# Patient Record
Sex: Female | Born: 2000 | Race: Black or African American | Hispanic: No | Smoking: Never smoker
Health system: Southern US, Community
[De-identification: ages and names within clinical notes are randomized; demographics above are authoritative.]

## PROBLEM LIST (undated history)

## (undated) ENCOUNTER — Inpatient Hospital Stay (HOSPITAL_COMMUNITY): Payer: Self-pay

## (undated) ENCOUNTER — Inpatient Hospital Stay (HOSPITAL_COMMUNITY): Payer: Medicaid Other | Admitting: Obstetrics and Gynecology

## (undated) DIAGNOSIS — B9689 Other specified bacterial agents as the cause of diseases classified elsewhere: Secondary | ICD-10-CM

## (undated) DIAGNOSIS — J45909 Unspecified asthma, uncomplicated: Secondary | ICD-10-CM

## (undated) DIAGNOSIS — R109 Unspecified abdominal pain: Secondary | ICD-10-CM

## (undated) DIAGNOSIS — R51 Headache: Secondary | ICD-10-CM

## (undated) DIAGNOSIS — N912 Amenorrhea, unspecified: Secondary | ICD-10-CM

## (undated) DIAGNOSIS — E669 Obesity, unspecified: Secondary | ICD-10-CM

## (undated) DIAGNOSIS — O26899 Other specified pregnancy related conditions, unspecified trimester: Secondary | ICD-10-CM

## (undated) DIAGNOSIS — N760 Acute vaginitis: Secondary | ICD-10-CM

## (undated) HISTORY — DX: Headache: R51

## (undated) HISTORY — DX: Obesity, unspecified: E66.9

## (undated) HISTORY — PX: WISDOM TOOTH EXTRACTION: SHX21

---
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
## 2018-01-07 ENCOUNTER — Inpatient Hospital Stay (HOSPITAL_COMMUNITY)
Admission: AD | Admit: 2018-01-07 | Discharge: 2018-01-09 | DRG: 807 | Disposition: A | Discharge status: Home / Self Care | Payer: Medicaid Other | Source: Ambulatory Visit | Attending: Obstetrics and Gynecology | Admitting: Obstetrics and Gynecology

## 2018-01-07 ENCOUNTER — Encounter (HOSPITAL_COMMUNITY): Payer: Self-pay | Admitting: *Deleted

## 2018-01-07 ENCOUNTER — Inpatient Hospital Stay (HOSPITAL_COMMUNITY): Payer: Medicaid Other | Admitting: Anesthesiology

## 2018-01-07 ENCOUNTER — Ambulatory Visit (INDEPENDENT_AMBULATORY_CARE_PROVIDER_SITE_OTHER): Payer: Medicaid Other | Admitting: Certified Nurse Midwife

## 2018-01-07 VITALS — BP 125/90 | HR 68 | Wt 183.9 lb

## 2018-01-07 DIAGNOSIS — O4292 Full-term premature rupture of membranes, unspecified as to length of time between rupture and onset of labor: Principal | ICD-10-CM | POA: Diagnosis present

## 2018-01-07 DIAGNOSIS — R109 Unspecified abdominal pain: Secondary | ICD-10-CM

## 2018-01-07 DIAGNOSIS — Z880 Allergy status to penicillin: Secondary | ICD-10-CM

## 2018-01-07 DIAGNOSIS — O26899 Other specified pregnancy related conditions, unspecified trimester: Secondary | ICD-10-CM | POA: Diagnosis present

## 2018-01-07 DIAGNOSIS — Z3A38 38 weeks gestation of pregnancy: Secondary | ICD-10-CM

## 2018-01-07 DIAGNOSIS — O99214 Obesity complicating childbirth: Secondary | ICD-10-CM | POA: Diagnosis present

## 2018-01-07 DIAGNOSIS — Z87891 Personal history of nicotine dependence: Secondary | ICD-10-CM

## 2018-01-07 DIAGNOSIS — E669 Obesity, unspecified: Secondary | ICD-10-CM | POA: Diagnosis present

## 2018-01-07 DIAGNOSIS — Z3402 Encounter for supervision of normal first pregnancy, second trimester: Secondary | ICD-10-CM

## 2018-01-07 DIAGNOSIS — Z3403 Encounter for supervision of normal first pregnancy, third trimester: Secondary | ICD-10-CM

## 2018-01-07 LAB — CBC
HEMATOCRIT: 36.5 % (ref 36.0–46.0)
HEMOGLOBIN: 12.4 g/dL (ref 12.0–15.0)
MCH: 29.5 pg (ref 26.0–34.0)
MCHC: 34 g/dL (ref 30.0–36.0)
MCV: 86.7 fL (ref 78.0–100.0)
Platelets: 187 10*3/uL (ref 150–400)
RBC: 4.21 MIL/uL (ref 3.87–5.11)
RDW: 13 % (ref 11.5–15.5)
WBC: 11.4 10*3/uL — ABNORMAL HIGH (ref 4.0–10.5)

## 2018-01-07 LAB — POCT FERN TEST: POCT FERN TEST: POSITIVE

## 2018-01-07 LAB — TYPE AND SCREEN
ABO/RH(D): O POS
ANTIBODY SCREEN: NEGATIVE

## 2018-01-07 MED ORDER — FLEET ENEMA 7-19 GM/118ML RE ENEM: 1.0000 | ENEMA | RECTAL | Status: DC | PRN

## 2018-01-07 MED ORDER — FENTANYL CITRATE (PF) 100 MCG/2ML IJ SOLN
100.0000 ug | INTRAMUSCULAR | Status: DC | PRN
  Administered 2018-01-07 (×2): 100 ug via INTRAVENOUS
  Filled 2018-01-07 (×2): qty 2

## 2018-01-07 MED ORDER — TETANUS-DIPHTH-ACELL PERTUSSIS 5-2.5-18.5 LF-MCG/0.5 IM SUSP
0.5000 mL | Freq: Once | INTRAMUSCULAR | Status: AC
  Administered 2018-01-08: 0.5 mL via INTRAMUSCULAR

## 2018-01-07 MED ORDER — DIPHENHYDRAMINE HCL 25 MG PO CAPS
25.0000 mg | ORAL_CAPSULE | Freq: Four times a day (QID) | ORAL | Status: DC | PRN
  Administered 2018-01-08: 25 mg via ORAL
  Filled 2018-01-07: qty 1

## 2018-01-07 MED ORDER — SOD CITRATE-CITRIC ACID 500-334 MG/5ML PO SOLN: 30.0000 mL | ORAL | Status: DC | PRN

## 2018-01-07 MED ORDER — LACTATED RINGERS IV SOLN: 500.0000 mL | INTRAVENOUS | Status: DC | PRN

## 2018-01-07 MED ORDER — SENNOSIDES-DOCUSATE SODIUM 8.6-50 MG PO TABS
2.0000 | ORAL_TABLET | ORAL | Status: DC
  Administered 2018-01-08 (×2): 2 via ORAL
  Filled 2018-01-07 (×2): qty 2

## 2018-01-07 MED ORDER — ONDANSETRON HCL 4 MG PO TABS: 4.0000 mg | ORAL_TABLET | ORAL | Status: DC | PRN

## 2018-01-07 MED ORDER — EPHEDRINE 5 MG/ML INJ
10.0000 mg | INTRAVENOUS | Status: DC | PRN
  Filled 2018-01-07: qty 2

## 2018-01-07 MED ORDER — OXYTOCIN 40 UNITS IN LACTATED RINGERS INFUSION - SIMPLE MED
2.5000 [IU]/h | INTRAVENOUS | Status: DC
  Administered 2018-01-07: 2.5 [IU]/h via INTRAVENOUS

## 2018-01-07 MED ORDER — FENTANYL 2.5 MCG/ML BUPIVACAINE 1/10 % EPIDURAL INFUSION (WH - ANES)
14.0000 mL/h | INTRAMUSCULAR | Status: DC | PRN
  Administered 2018-01-07: 14 mL/h via EPIDURAL
  Filled 2018-01-07: qty 100

## 2018-01-07 MED ORDER — DIBUCAINE 1 % RE OINT: 1.0000 "application " | TOPICAL_OINTMENT | RECTAL | Status: DC | PRN

## 2018-01-07 MED ORDER — OXYTOCIN 40 UNITS IN LACTATED RINGERS INFUSION - SIMPLE MED
1.0000 m[IU]/min | INTRAVENOUS | Status: DC
  Administered 2018-01-07: 4 m[IU]/min via INTRAVENOUS
  Administered 2018-01-07: 2 m[IU]/min via INTRAVENOUS
  Filled 2018-01-07: qty 1000

## 2018-01-07 MED ORDER — ONDANSETRON HCL 4 MG/2ML IJ SOLN
4.0000 mg | Freq: Four times a day (QID) | INTRAMUSCULAR | Status: DC | PRN
  Administered 2018-01-07: 4 mg via INTRAVENOUS
  Filled 2018-01-07: qty 2

## 2018-01-07 MED ORDER — ACETAMINOPHEN 325 MG PO TABS: 650.0000 mg | ORAL_TABLET | ORAL | Status: DC | PRN

## 2018-01-07 MED ORDER — OXYCODONE-ACETAMINOPHEN 5-325 MG PO TABS: 1.0000 | ORAL_TABLET | ORAL | Status: DC | PRN

## 2018-01-07 MED ORDER — MISOPROSTOL 200 MCG PO TABS
600.0000 ug | ORAL_TABLET | Freq: Once | ORAL | Status: AC
  Administered 2018-01-07: 600 ug via ORAL

## 2018-01-07 MED ORDER — BENZOCAINE-MENTHOL 20-0.5 % EX AERO
1.0000 "application " | INHALATION_SPRAY | CUTANEOUS | Status: DC | PRN
  Administered 2018-01-08: 1 via TOPICAL
  Filled 2018-01-07: qty 56

## 2018-01-07 MED ORDER — OXYCODONE-ACETAMINOPHEN 5-325 MG PO TABS: 2.0000 | ORAL_TABLET | ORAL | Status: DC | PRN

## 2018-01-07 MED ORDER — LACTATED RINGERS IV SOLN
INTRAVENOUS | Status: DC
  Administered 2018-01-07 (×2): via INTRAVENOUS

## 2018-01-07 MED ORDER — PRENATAL MULTIVITAMIN CH
1.0000 | ORAL_TABLET | Freq: Every day | ORAL | Status: DC
  Administered 2018-01-08: 1 via ORAL
  Filled 2018-01-07: qty 1

## 2018-01-07 MED ORDER — COCONUT OIL OIL
1.0000 "application " | TOPICAL_OIL | Status: DC | PRN
  Filled 2018-01-07: qty 120

## 2018-01-07 MED ORDER — PHENYLEPHRINE 40 MCG/ML (10ML) SYRINGE FOR IV PUSH (FOR BLOOD PRESSURE SUPPORT)
80.0000 ug | PREFILLED_SYRINGE | INTRAVENOUS | Status: DC | PRN
  Filled 2018-01-07: qty 5
  Filled 2018-01-07: qty 10

## 2018-01-07 MED ORDER — ONDANSETRON HCL 4 MG/2ML IJ SOLN: 4.0000 mg | INTRAMUSCULAR | Status: DC | PRN

## 2018-01-07 MED ORDER — LACTATED RINGERS IV SOLN: 500.0000 mL | Freq: Once | INTRAVENOUS | Status: DC

## 2018-01-07 MED ORDER — ACETAMINOPHEN 325 MG PO TABS
650.0000 mg | ORAL_TABLET | ORAL | Status: DC | PRN
  Administered 2018-01-08 – 2018-01-09 (×3): 650 mg via ORAL
  Filled 2018-01-07 (×3): qty 2

## 2018-01-07 MED ORDER — LIDOCAINE HCL (PF) 1 % IJ SOLN
INTRAMUSCULAR | Status: DC | PRN
  Administered 2018-01-07: 6 mL via EPIDURAL
  Administered 2018-01-07: 4 mL

## 2018-01-07 MED ORDER — PHENYLEPHRINE 40 MCG/ML (10ML) SYRINGE FOR IV PUSH (FOR BLOOD PRESSURE SUPPORT)
80.0000 ug | PREFILLED_SYRINGE | INTRAVENOUS | Status: DC | PRN
  Filled 2018-01-07: qty 5

## 2018-01-07 MED ORDER — ZOLPIDEM TARTRATE 5 MG PO TABS: 5.0000 mg | ORAL_TABLET | Freq: Every evening | ORAL | Status: DC | PRN

## 2018-01-07 MED ORDER — IBUPROFEN 600 MG PO TABS
600.0000 mg | ORAL_TABLET | Freq: Four times a day (QID) | ORAL | Status: DC
  Administered 2018-01-08 – 2018-01-09 (×6): 600 mg via ORAL
  Filled 2018-01-07 (×7): qty 1

## 2018-01-07 MED ORDER — LIDOCAINE HCL (PF) 1 % IJ SOLN
30.0000 mL | INTRAMUSCULAR | Status: DC | PRN
  Filled 2018-01-07: qty 30

## 2018-01-07 MED ORDER — WITCH HAZEL-GLYCERIN EX PADS: 1.0000 "application " | MEDICATED_PAD | CUTANEOUS | Status: DC | PRN

## 2018-01-07 MED ORDER — DIPHENHYDRAMINE HCL 50 MG/ML IJ SOLN: 12.5000 mg | INTRAMUSCULAR | Status: DC | PRN

## 2018-01-07 MED ORDER — BUDESONIDE 0.25 MG/2ML IN SUSP
0.2500 mg | Freq: Two times a day (BID) | RESPIRATORY_TRACT | Status: DC
  Filled 2018-01-07 (×4): qty 2

## 2018-01-07 MED ORDER — SIMETHICONE 80 MG PO CHEW: 80.0000 mg | CHEWABLE_TABLET | ORAL | Status: DC | PRN

## 2018-01-07 MED ORDER — TERBUTALINE SULFATE 1 MG/ML IJ SOLN
0.2500 mg | Freq: Once | INTRAMUSCULAR | Status: DC | PRN
  Filled 2018-01-07: qty 1

## 2018-01-07 MED ORDER — OXYTOCIN BOLUS FROM INFUSION
500.0000 mL | Freq: Once | INTRAVENOUS | Status: AC
  Administered 2018-01-07: 500 mL via INTRAVENOUS

## 2018-01-07 MED ORDER — MISOPROSTOL 200 MCG PO TABS
ORAL_TABLET | ORAL | Status: AC
  Filled 2018-01-07: qty 3

## 2018-01-07 NOTE — Anesthesia Procedure Notes (Signed)
Epidural Patient location during procedure: OB  Staffing Anesthesiologist: Cristela BlueJackson, Cylee Dattilo, MD  Preanesthetic Checklist Completed: patient identified, pre-op evaluation, timeout performed, IV checked, risks and benefits discussed and monitors and equipment checked  Epidural Patient position: sitting Prep: DuraPrep Patient monitoring: blood pressure and continuous pulse ox Approach: right paramedian Location: L3-L4 Injection technique: LOR air  Needle:  Needle type: Tuohy  Needle gauge: 17 G Needle insertion depth: 6 cm Catheter type: closed end flexible Catheter size: 19 Gauge Catheter at skin depth: 13 cm Test dose: negative  Assessment Sensory level: T8  Additional Notes   Dosing of Epidural:  1st dose, through catheter ............................................Marland Kitchen.  Xylocaine 40 mg  2nd dose, through catheter, after waiting 3 minutes........Marland Kitchen.Xylocaine 60 mg    As each dose occurred, patient was free of IV sx; and patient exhibited no evidence of SA injection.  Patient is more comfortable after epidural dosed. Please see RN's note for documentation of vital signs,and FHR which are stable.  Patient reminded not to try to ambulate with numb legs, and that an RN must be present when she attempts to get up.

## 2018-01-07 NOTE — Progress Notes (Signed)
Kristie Mcintosh is a 17 y.o. G1P0000 at 5835w4d  admitted for rupture of membranes early labor  Subjective: Pt having a Cat II strip with variable decels to 80's with contractions, pitocin has now been d/c'd and amnioinfusion initiated.  Bedside u/s shows a LNC x 1 at shoulder and under chin.   Objective: BP (!) 98/49   Pulse 64   Temp 98.3 F (36.8 C) (Oral)   Resp 18   Ht 4\' 11"  (1.499 m)   Wt 183 lb (83 kg)   LMP 04/12/2017   SpO2 100%   BMI 36.96 kg/m  No intake/output data recorded. Total I/O In: -  Out: 200 [Urine:200]  FHT:  FHR: 145 bpm, variability: moderate,  accelerations:  Present,  decelerations:  Present variables  UC:   regular, every 3 minutes SVE:   Dilation: 6 Effacement (%): 90 Station: -2 Exam by:: Dr. Emelda FearFerguson  Labs: Lab Results  Component Value Date   WBC 11.4 (H) 01/07/2018   HGB 12.4 01/07/2018   HCT 36.5 01/07/2018   MCV 86.7 01/07/2018   PLT 187 01/07/2018    Assessment / Plan: Spontaneous labor, progressing normally  Labor: watching cautously, will try terbutalene x1 if decels persist. Preeclampsia:   Fetal Wellbeing:  Category II Pain Control:  Epidural I/D:  n/a Anticipated MOD:  uncertain   PT informed of our concerns and the possibility that we will recommend cesarean if the decels progress or fail to improve.  Kristie Mcintosh 01/07/2018, 6:23 PM

## 2018-01-07 NOTE — MAU Note (Signed)
Leaking fluid, clothes wet, back to restroom to change

## 2018-01-07 NOTE — MAU Note (Signed)
Pt presents with c/o LOF @ 0800 this morning, fluid clear.  Denies onset of ctxs.  Denies VB.  Reports +FM.

## 2018-01-07 NOTE — Progress Notes (Signed)
Pt c/o leaking water and irregular contractions.  She states she is unsure if urine or if water has broken. Pt sent to MAU d/t water breakage.

## 2018-01-07 NOTE — Anesthesia Pain Management Evaluation Note (Signed)
  CRNA Pain Management Visit Note  Patient: Kristie Mcintosh, 17 y.o., female  "Hello I am a member of the anesthesia team at [facility]. We have an anesthesia team available at all times to provide care throughout the hospital, including epidural management and anesthesia for C-section. I don't know your plan for the delivery whether it a natural birth, water birth, IV sedation, nitrous supplementation, doula or epidural, but we want to meet your pain goals."   1.Was your pain managed to your expectations on prior hospitalizations?   No prior hospitalizations  2.What is your expectation for pain management during this hospitalization?     Epidural and IV pain meds  3.How can we help you reach that goal? IV pain meds, epidural when ready. Patient is aware of all options.  Record the patient's initial score and the patient's pain goal.   Pain: 6--RN Trula Orehristina made aware of patient's desire for pain meds  Pain Goal: 3 [facility] wants you to be able to say your pain was always managed very well.  Ronnisha Felber L 01/07/2018

## 2018-01-07 NOTE — Anesthesia Preprocedure Evaluation (Signed)
Anesthesia Evaluation  Patient identified by MRN, date of birth, ID band Patient awake    Reviewed: Allergy & Precautions, H&P , Patient's Chart, lab work & pertinent test results  Airway Mallampati: II  TM Distance: >3 FB Neck ROM: full    Dental no notable dental hx.    Pulmonary asthma , former smoker,    Pulmonary exam normal breath sounds clear to auscultation       Cardiovascular Exercise Tolerance: Good  Rhythm:regular Rate:Normal     Neuro/Psych    GI/Hepatic   Endo/Other  Morbid obesity  Renal/GU      Musculoskeletal   Abdominal   Peds  Hematology   Anesthesia Other Findings Rare inhaler use  Reproductive/Obstetrics                             Anesthesia Physical Anesthesia Plan  ASA: II  Anesthesia Plan: Epidural   Post-op Pain Management:    Induction:   PONV Risk Score and Plan:   Airway Management Planned:   Additional Equipment:   Intra-op Plan:   Post-operative Plan:   Informed Consent: I have reviewed the patients History and Physical, chart, labs and discussed the procedure including the risks, benefits and alternatives for the proposed anesthesia with the patient or authorized representative who has indicated his/her understanding and acceptance.   Dental Advisory Given  Plan Discussed with:   Anesthesia Plan Comments: (Labs checked- platelets confirmed with RN in room. Fetal heart tracing, per RN, reported to be stable enough for sitting procedure. Discussed epidural, and patient consents to the procedure:  included risk of possible headache,backache, failed block, allergic reaction, and nerve injury. This patient was asked if she had any questions or concerns before the procedure started.)        Anesthesia Quick Evaluation

## 2018-01-07 NOTE — H&P (Signed)
Kristie Mcintosh is a 17 y.o. female G1P0000 @[redacted]w[redacted]d  presenting for SROM 01/07/18 at 0800.  She reports good fetal movement, denies vaginal bleeding, vaginal itching/burning, urinary symptoms, h/a, dizziness, n/v, or fever/chills.     Clinic [facility]  Prenatal Labs  Dating lmp c/w 5.5w sono Blood type: O/Positive/-- (07/23 1543) o pos  Genetic Screen 1 Screen: nl   AFP:     neg Antibody:Negative (07/23 1543)Neg  Anatomic US  normal; female fetus Rubella: 1.64 (07/23 1543)Immune  GTT Third trimester: WNL RPR: Non Reactive (11/29 1030) NR  Flu vaccine  declined 09/25/17 HBsAg: Negative (07/23 1543) Neg  TDaP vaccine   Declined 11/06/17                                Rhogam:n/a O+ HIV: Non Reactive (11/29 1030) NR  Baby Food Breast                                       GBS: NEG  Contraception Nexplanon Pap: underage   Circumcision  yes   Pediatrician Dr. Deatra Cantereclair  CF: negative  Support Person Pt's father  SMA  Prenatal Classes No Hgb electrophoresis: nl   OB History    Gravida Para Term Preterm AB Living   1 0 0 0 0 0   SAB TAB Ectopic Multiple Live Births   0 0 0 0 0     Past Medical History:  Diagnosis Date  . Abdominal pain affecting pregnancy 05/16/2017  . Amenorrhea 05/16/2017  . Asthma   . Bacterial vaginitis 05/16/2017  . Headache(784.0)   . Obesity    Past Surgical History:  Procedure Laterality Date  . ADENOIDECTOMY AND MYRINGOTOMY WITH TUBE PLACEMENT Bilateral 2001  . WISDOM TOOTH EXTRACTION Bilateral    Family History: family history includes Autism in her cousin; Bipolar disorder in her paternal aunt; Depression in her maternal grandmother; Diabetes in her other; Headache in her mother; Heart Problems in her maternal grandfather; Heart disease in her mother and other; Hyperlipidemia in her other; Hypertension in her other; Migraines in her maternal grandmother. Social History:  reports that she quit smoking about 8 months ago. Her smoking use included cigarettes. She smoked 0.25  packs per day. she has never used smokeless tobacco. She reports that she does not drink alcohol or use drugs.     Maternal Diabetes: No Genetic Screening: Normal Maternal Ultrasounds/Referrals: Normal Fetal Ultrasounds or other Referrals:  None Maternal Substance Abuse:  No Significant Maternal Medications:  None Significant Maternal Lab Results:  Lab values include: Group B Strep negative Other Comments:  None  Review of Systems  Constitutional: Negative for chills, fever and malaise/fatigue.  Eyes: Negative for blurred vision.  Respiratory: Negative for cough and shortness of breath.   Cardiovascular: Negative for chest pain.  Gastrointestinal: Positive for abdominal pain. Negative for heartburn and vomiting.  Genitourinary: Negative for dysuria, frequency and urgency.  Musculoskeletal: Negative.   Neurological: Negative for dizziness and headaches.  Psychiatric/Behavioral: Negative for depression.   Maternal Medical History:  Reason for admission: Rupture of membranes.   Contractions: Onset was 6-12 hours ago.   Frequency: irregular.   Perceived severity is mild.    Fetal activity: Perceived fetal activity is normal.   Last perceived fetal movement was within the past hour.    Prenatal complications: no prenatal complications Prenatal  Complications - Diabetes: none.      Blood pressure 118/82, pulse 86, temperature 98 F (36.7 C), temperature source Oral, resp. rate 18, weight 181 lb 1.3 oz (82.1 kg), last menstrual period 04/12/2017, SpO2 95 %. Maternal Exam:  Uterine Assessment: Contraction strength is mild.  Contraction frequency is irregular.   Abdomen: Fetal presentation: vertex  Cervix: Cervix evaluated by digital exam.     Fetal Exam Fetal Monitor Review: Mode: ultrasound.   Baseline rate: 135.  Variability: moderate (6-25 bpm).   Pattern: accelerations present and no decelerations.    Fetal State Assessment: Category I - tracings are  normal.     Physical Exam  Nursing note and vitals reviewed. Constitutional: She is oriented to person, place, and time. She appears well-developed and well-nourished.  Neck: Normal range of motion.  Cardiovascular: Normal rate and regular rhythm.  Respiratory: Effort normal and breath sounds normal.  GI: Soft.  Musculoskeletal: Normal range of motion.  Neurological: She is alert and oriented to person, place, and time.  Skin: Skin is warm and dry.  Psychiatric: She has a normal mood and affect. Her behavior is normal. Judgment and thought content normal.    Prenatal labs: ABO, Rh: O/Positive/-- (07/23 1543) Antibody: Negative (07/23 1543) Rubella: 1.64 (07/23 1543) RPR: Non Reactive (11/29 1030)  HBsAg: Negative (07/23 1543)  HIV: Non Reactive (11/29 1030)  GBS:   negative  Assessment/Plan: G1P0000 @[redacted]w[redacted]d  SROM with onset of active labor GBS neg  Admit to [facility] Expectant management Anticipate NSVD   Sharen Counter 01/07/2018, 9:15 AM

## 2018-01-07 NOTE — Progress Notes (Signed)
Kristie Mcintosh is a 17 y.o. G1P0000 at 5650w4d by early ultrasound admitted for PROM and early labor.  Subjective: Pt comfortable with epidural, s/o at bedside for support.  Objective: BP 117/76   Pulse 70   Temp 97.9 F (36.6 C) (Oral)   Resp 18   Ht 4\' 11"  (1.499 m)   Wt 183 lb (83 kg)   LMP 04/12/2017   SpO2 100%   BMI 36.96 kg/m  No intake/output data recorded. No intake/output data recorded.  FHT:  FHR: 135 bpm, variability: moderate,  accelerations:  Present,  decelerations:  Present intermittent variables UC:   regular, every 2-3 minutes SVE:   Dilation: 4 Effacement (%): 90 Station: -2 Exam by::L. Leftwich-Kirby, CNM  IUPC placed during exam, placed without difficulty, pt tolerated well  Labs: Lab Results  Component Value Date   WBC 11.4 (H) 01/07/2018   HGB 12.4 01/07/2018   HCT 36.5 01/07/2018   MCV 86.7 01/07/2018   PLT 187 01/07/2018    Assessment / Plan: Protracted latent phase  Labor: Pitocin ordered to start at 2 milliunits/min, increase by 2 per protocol Preeclampsia:  n/a Fetal Wellbeing:  Category II Pain Control:  Epidural I/D:  GBS neg Anticipated MOD:  NSVD  Sharen CounterLisa Leftwich-Kirby 01/07/2018, 4:10 PM

## 2018-01-08 ENCOUNTER — Other Ambulatory Visit: Payer: Self-pay

## 2018-01-08 LAB — CBC
HEMATOCRIT: 26.1 % — AB (ref 36.0–46.0)
HEMOGLOBIN: 9.3 g/dL — AB (ref 12.0–15.0)
MCH: 30.7 pg (ref 26.0–34.0)
MCHC: 35.6 g/dL (ref 30.0–36.0)
MCV: 86.1 fL (ref 78.0–100.0)
Platelets: 152 10*3/uL (ref 150–400)
RBC: 3.03 MIL/uL — ABNORMAL LOW (ref 3.87–5.11)
RDW: 12.9 % (ref 11.5–15.5)
WBC: 19.4 10*3/uL — ABNORMAL HIGH (ref 4.0–10.5)

## 2018-01-08 LAB — RPR: RPR Ser Ql: NONREACTIVE

## 2018-01-08 MED ORDER — MEDROXYPROGESTERONE ACETATE 150 MG/ML IM SUSP: 150.0000 mg | Freq: Once | INTRAMUSCULAR | Status: DC

## 2018-01-08 MED ORDER — LIDOCAINE HCL 1 % IJ SOLN
0.0000 mL | Freq: Once | INTRAMUSCULAR | Status: DC | PRN
  Filled 2018-01-08: qty 20

## 2018-01-08 MED ORDER — ETONOGESTREL 68 MG ~~LOC~~ IMPL
68.0000 mg | DRUG_IMPLANT | Freq: Once | SUBCUTANEOUS | Status: DC
  Filled 2018-01-08: qty 1

## 2018-01-08 MED ORDER — OXYCODONE HCL 5 MG PO TABS
5.0000 mg | ORAL_TABLET | ORAL | Status: DC | PRN
  Administered 2018-01-08 – 2018-01-09 (×3): 5 mg via ORAL
  Filled 2018-01-08 (×2): qty 1

## 2018-01-08 NOTE — Progress Notes (Signed)
POSTPARTUM PROGRESS NOTE  Post Partum Day 1  Subjective:  Kristie Mcintosh is a 17 y.o. G1P1001 s/p SVD at 7924w4d.  No acute events overnight.  Pt denies problems with ambulating, voiding or po intake.  She denies nausea or vomiting.  Pain is well controlled.  She has no complaints of dizziness or light headedness.  She has had flatus. She has not had bowel movement.  Lochia Small- less bleeding than yesterday. She reports no clots.   Objective: Blood pressure 99/64, pulse 73, temperature 98.4 F (36.9 C), temperature source Oral, resp. rate 18, height 4\' 11"  (1.499 m), weight 83 kg (183 lb), last menstrual period 04/12/2017, SpO2 99 %, unknown if currently breastfeeding.  Physical Exam:  General: alert, cooperative and no distress Chest: no respiratory distress Heart:regular rate, distal pulses intact Abdomen: soft, nontender Uterine Fundus U/2: firm, appropriately tender DVT Evaluation: No calf swelling or tenderness Extremities: no peripheral edema Laceration: 1st degree not repaired- hemostatic, healing appropriately    Recent Labs    01/07/18 1028 01/08/18 0538  HGB 12.4 9.3*  HCT 36.5 26.1*    Assessment/Plan: Kristie HerbJalyn K Sachs is a 17 y.o. G1P1001 s/p SVD with minimal blood loss at 6424w4d   PPD#1 - Doing well with no complaints.  Hgb drop of 3.1 over 24 hours. She denies any symptoms related to drop.   Will repeat CBC in morning prior to discharge. Will start iron PO at discharge if morning CBC stable.   Contraception: nexplanon- will be placed inpatient prior to discharge Feeding: breast with supplementation of formula  Dispo: Plan for discharge tomorrow pending lab results.   LOS: 1 day   Ames CoupeCharles A McLendon MS3 01/08/2018, 8:30 AM   I confirm that I have verified the information documented in the medical student's's note and that I have also personally reperformed the physical exam and all medical decision making activities.  Sharyon CableVeronica C Dainel Arcidiacono, CNM 01/08/18, 9:59  AM

## 2018-01-08 NOTE — Progress Notes (Signed)
Patient is complaining of pain 7/10.  States that tylenol is not helping called MD to request additional PRN medication.

## 2018-01-08 NOTE — Progress Notes (Signed)
Agree with LPN assessment, sent to triage with suspected gross ROM before patient got to the office.  R.Denney CNM

## 2018-01-08 NOTE — Lactation Note (Signed)
This note was copied from a baby's chart. Lactation Consultation Note Baby 8 hrs old. Mom is breast/formula feeding. Encouraged mom to BF before giving formula. Discuss need to use DEBP for stimulation and supplementation d/t less than 6 lb baby.  Mom agreed. Mom has "V" shaped breast w/everted compressible nipple at the bottom end of the breast. Hand expression demonstrated w/colostrum noted.  Encouraged STS, stressed importance of I&O. As LC putting baby to breast, baby has spit up. Mom stated baby has been spity.  Newborn behavior, feeding habits, supply and demand discussed. Mom encouraged to feed baby 8-12 times/24 hours and with feeding cues. Mom knows to pump q3h for 15-20 min. Mom encouraged to waken baby for feeds if hasn't cued in 3 hrs. Supplementation quide lines reviewed and information sheet given of amount according to hours of age.  Mom states understanding. Discussed positions, support and comfort during BF. Encouraged to call for assistance or questions.  WH/LC brochure given w/resources, support groups and LC services.  Patient Name: Kristie Mcintosh EAVWU'JToday's Date: 01/08/2018 Reason for consult: Initial assessment;Infant < 6lbs   Maternal Data Has patient been taught Hand Expression?: Yes Does the patient have breastfeeding experience prior to this delivery?: No  Feeding Length of feed: 0 min  LATCH Score Latch: Too sleepy or reluctant, no latch achieved, no sucking elicited.  Audible Swallowing: None  Type of Nipple: Everted at rest and after stimulation  Comfort (Breast/Nipple): Soft / non-tender  Hold (Positioning): Assistance needed to correctly position infant at breast and maintain latch.  LATCH Score: 5  Interventions Interventions: Breast feeding basics reviewed;Assisted with latch;Breast compression;Skin to skin;Adjust position;Breast massage;Support pillows;DEBP;Hand express;Position options  Lactation Tools Discussed/Used Tools: Pump Breast pump  type: Double-Electric Breast Pump WIC Program: Yes Pump Review: Setup, frequency, and cleaning;Milk Storage Initiated by:: Peri JeffersonL. Lillith Mcneff RN IBCLC Date initiated:: 01/08/18   Consult Status Consult Status: Follow-up Date: 01/08/18 Follow-up type: In-patient    [name], [name] 01/08/2018, 4:11 AM

## 2018-01-08 NOTE — Progress Notes (Signed)
CSW met with MOB in room 110 to complete an assessment for anxiety/depression.   When CSW arrived, MOB was bonding with infant as evidence by holding and admiring him.  MOB appeared happy and comfortable caring for infant.  MOB was easy to engage, polite, and receptive to meeting with CSW.   CSW asked about MOB's MH hx.  MOB denied having a MH hx attributed her feelings of sadness to the unexpected loss of MOB's mother on 02/19/2016.  CSW normalized all feeling/symptoms that MOB displayed after the passing of MOB's mother. MOB reported greatly missing her mother but finding peace having her own baby.  CSW offered MOB resources for outpatient counseling and MOB declined.   CSW provide education regarding the baby blues period vs. perinatal mood disorders, discussed treatment and gave resources for mental health follow up if concerns arise.  CSW recommends self-evaluation during the postpartum time period using the New Mom Checklist from Postpartum Progress and encouraged MOB to contact a medical professional if symptoms are noted at any time.  MOB did not present with any acute symptoms and denied SI, HI, and DV.    MOB reported having a strong support system and feeling prepared to parent.  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   CSW provided review of Sudden Infant Death Syndrome (SIDS) precautions.    CSW identifies no further need for intervention and no barriers to discharge at this time.  Laurey Arrow, MSW, LCSW Clinical Social Work [phone]

## 2018-01-09 MED ORDER — SENNOSIDES-DOCUSATE SODIUM 8.6-50 MG PO TABS: 2.0000 | ORAL_TABLET | ORAL | 0 refills | Status: DC

## 2018-01-09 MED ORDER — IBUPROFEN 600 MG PO TABS: 600.0000 mg | ORAL_TABLET | Freq: Four times a day (QID) | ORAL | 0 refills | Status: DC

## 2018-01-09 NOTE — Discharge Summary (Signed)
OB Discharge Summary     Patient Name: Kristie Mcintosh DOB: 07-03-2001 MRN: [id number]  Date of admission: 01/07/2018 Delivering MD: Tilda Burrow   Date of discharge: 01/09/2018  Admitting diagnosis: 38WKS,WATER BROKE Intrauterine pregnancy: [redacted]w[redacted]d     Secondary diagnosis:   Patient Active Problem List   Diagnosis Date Noted  . Normal labor and delivery 01/07/2018  . Encounter for supervision of normal first pregnancy in second trimester 06/16/2017  . Bacterial vaginitis 05/16/2017  . Abdominal pain affecting pregnancy 05/16/2017  . Tension headache 08/11/2013  . Migraine without aura and without status migrainosus, not intractable 08/11/2013  . Insomnia 08/11/2013  . 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 ADD (attention deficit disorder) 08/11/2013  . Obesity (BMI 30-39.9) 08/11/2013    Additional problems: none     Discharge diagnosis: Term Pregnancy Delivered                                                                                                Post partum procedures:none  Augmentation: Pitocin  Complications: None  Hospital course:  Onset of Labor With Vaginal Delivery     17 y.o. yo G1P1001 at [redacted]w[redacted]d was admitted in Active Labor on 01/07/2018. Patient had an uncomplicated labor course as follows:  Membrane Rupture Time/Date: 8:00 AM ,01/07/2018   Intrapartum Procedures: Episiotomy: None [1]                                         Lacerations:  1st degree [2]  Patient had a delivery of a Viable infant. 01/07/2018  Information for the patient's newborn:  Emely, Fahy [[id number]]  Delivery Method: Vag-Spont    Pateint had an uncomplicated postpartum course.  She is ambulating, tolerating a regular diet, passing flatus, and urinating well. Patient is discharged home in stable condition on 01/09/18. She had a SW consult during this visit with no barriers to d/c.   Physical exam  Vitals:   01/08/18 0345 01/08/18 0920  01/08/18 1754 01/09/18 0529  BP: 99/64 (!) 106/57 110/68 112/70  Pulse: 73 76 67 67  Resp: 18 16 18 18   Temp: 98.4 F (36.9 C) 98.2 F (36.8 C) 98.1 F (36.7 C) 98.4 F (36.9 C)  TempSrc: Oral Oral Oral Oral  SpO2: 99% 100%    Weight:      Height:       General: alert, cooperative and no distress Lochia: appropriate Uterine Fundus: firm Incision: N/A DVT Evaluation: No evidence of DVT seen on physical exam. No cords or calf tenderness. No significant calf/ankle edema. Labs: Lab Results  Component Value Date   WBC 19.4 (H) 01/08/2018   HGB 9.3 (L) 01/08/2018   HCT 26.1 (L) 01/08/2018   MCV 86.1 01/08/2018   PLT 152 01/08/2018   CMP Latest Ref Rng & Units 08/04/2017  Glucose 65 - 99 mg/dL 85  BUN 6 - 20 mg/dL 8  Creatinine 2.72 - 5.36 mg/dL 6.44  Sodium 034 - 742  mmol/L 136  Potassium 3.5 - 5.1 mmol/L 3.4(L)  Chloride 101 - 111 mmol/L 104  CO2 22 - 32 mmol/L 21(L)  Calcium 8.9 - 10.3 mg/dL 9.4  Total Protein 6.5 - 8.1 g/dL 8.0  Total Bilirubin 0.3 - 1.2 mg/dL 0.7  Alkaline Phos 38 - 126 U/L 54  AST 15 - 41 U/L 18  ALT 14 - 54 U/L 12(L)    Discharge instruction: per After Visit Summary and "Baby and Me Booklet".  After visit meds:  Allergies as of 01/09/2018      Reactions   Eggs Or Egg-derived Products Anaphylaxis   Peanuts [peanut Oil] Anaphylaxis   Tree nuts   Penicillin G Anaphylaxis   Has patient had a PCN reaction causing immediate rash, facial/tongue/throat swelling, SOB or lightheadedness with hypotension: yes Has patient had a PCN reaction causing severe rash involving mucus membranes or skin necrosis: yes Has patient had a PCN reaction that required hospitalization: no Has patient had a PCN reaction occurring within the last 10 years: yes If all of the above answers are "NO", then may proceed with Cephalosporin use.      Medication List    STOP taking these medications   COMFORT FIT MATERNITY SUPP LG Misc     TAKE these medications    beclomethasone 40 MCG/ACT inhaler Commonly known as:  QVAR REDIHALER Inhale 2 puffs into the lungs 2 (two) times daily.   ibuprofen 600 MG tablet Commonly known as:  ADVIL,MOTRIN Take 1 tablet (600 mg total) by mouth every 6 (six) hours.   senna-docusate 8.6-50 MG tablet Commonly known as:  Senokot-S Take 2 tablets by mouth daily. Start taking on:  01/10/2018   VITAFOL-NANO 18-0.6-0.4 MG Tabs Take 1 tablet by mouth daily.       Diet: routine diet  Activity: Advance as tolerated. Pelvic rest for 6 weeks.   Outpatient follow up:4 weeks Follow up Appt:No future appointments. Follow up Visit:No Follow-up on file. Follow-up Information    [facility]. Schedule an appointment as soon as possible for a visit in 4 day(s).   Specialty:  Obstetrics and Gynecology Why:  Please follow up in 4 weeks for post partum check Contact information: [address] [place] [address] [phone]          Postpartum contraception: Depo Provera  Newborn Data: Live born female  Birth Weight: 5 lb 12.4 oz (2620 g) APGAR: 9, 9  Newborn Delivery   Birth date/time:  01/07/2018 19:47:00 Delivery type:  Vaginal, Spontaneous     Baby Feeding: Breast Disposition:home with mother   01/09/2018 Oralia Manis, DO  PGY-1  CNM attestation I have seen and examined this patient and agree with above documentation in the resident's note.   Kristie Mcintosh is a 17 y.o. G1P1001 s/p SVD.   Pain is well controlled.  Plan for birth control is Depo-Provera.  Method of Feeding: breast  PE:  BP 112/70 (BP Location: Right Arm)   Pulse 67   Temp 98.4 F (36.9 C) (Oral)   Resp 18   Ht 4\' 11"  (1.499 m)   Wt 83 kg (183 lb)   LMP 04/12/2017   SpO2 100%   Breastfeeding? Unknown   BMI 36.96 kg/m  Fundus firm  Recent Labs    01/07/18 1028 01/08/18 0538  HGB 12.4 9.3*  HCT 36.5 26.1*     Plan: discharge today - postpartum care  discussed - f/u clinic in 4 weeks  for postpartum visit   Cam HaiSHAW, Carlen Fils, CNM 11:46 AM 01/09/2018

## 2018-01-09 NOTE — Discharge Instructions (Signed)

## 2018-01-09 NOTE — Anesthesia Postprocedure Evaluation (Signed)
Anesthesia Post Note  Patient: Kristie Mcintosh  Procedure(s) Performed: AN AD HOC LABOR EPIDURAL     Patient location during evaluation: Mother Baby Anesthesia Type: Epidural Level of consciousness: awake and alert and oriented Pain management: satisfactory to patient Vital Signs Assessment: post-procedure vital signs reviewed and stable Respiratory status: respiratory function stable Cardiovascular status: stable Postop Assessment: no headache, no backache, epidural receding, patient able to bend at knees, no signs of nausea or vomiting and adequate PO intake Anesthetic complications: no    Last Vitals:  Vitals:   01/08/18 1754 01/09/18 0529  BP: 110/68 112/70  Pulse: 67 67  Resp: 18 18  Temp: 36.7 C 36.9 C  SpO2:      Last Pain:  Vitals:   01/09/18 0900  TempSrc:   PainSc: 6    Pain Goal: Patients Stated Pain Goal: 2 (01/09/18 0900)               Karleen DolphinFUSSELL,Hau Sanor

## 2018-01-11 IMAGING — US US MFM OB COMP +14 WKS
2 series · 14 of 28 positions shown · non-contrast
Comparison: none

[Series 1: us mfm ob comp +14 wks · 59 acquisitions, 13 frames shown (1 of 2)]
[im 3/59]
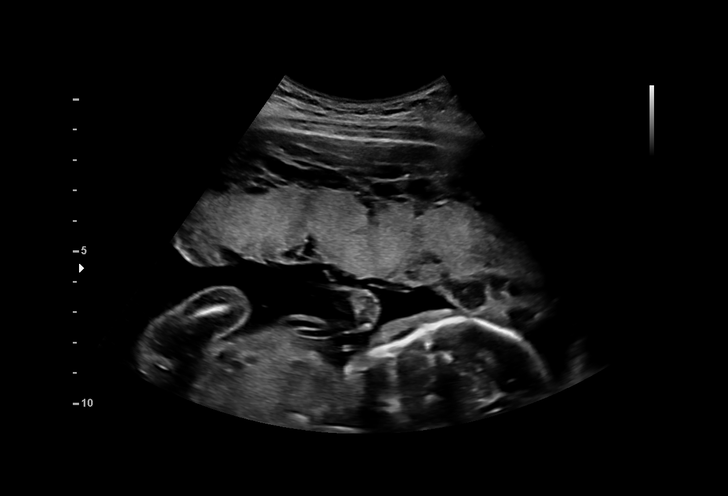
[im 7/59]
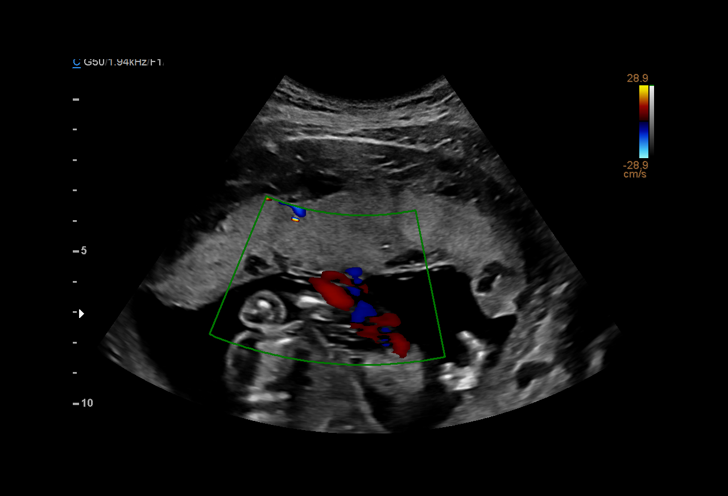
[im 12/59]
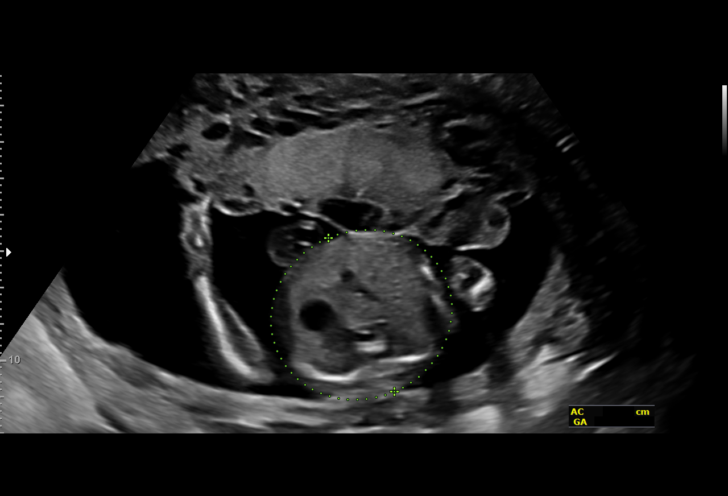
[im 16/59]
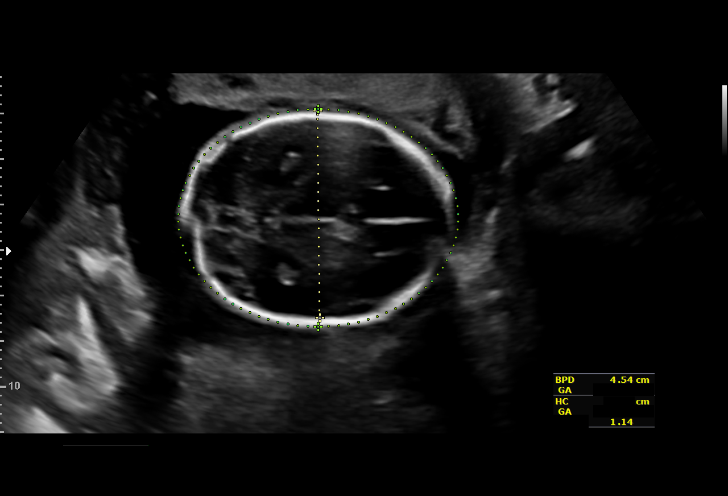
[im 21/59]
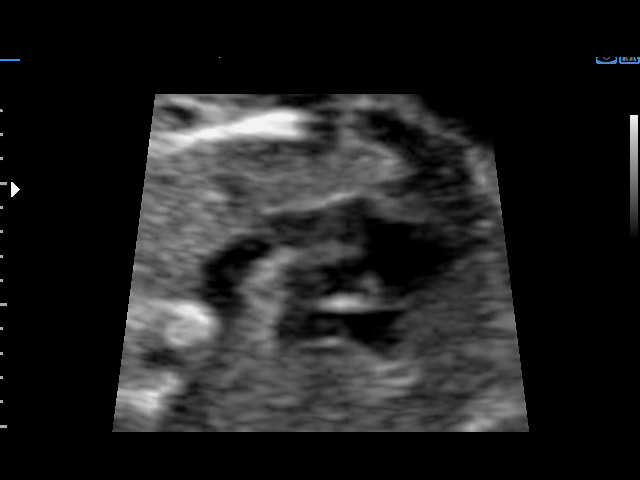
[im 25/59]
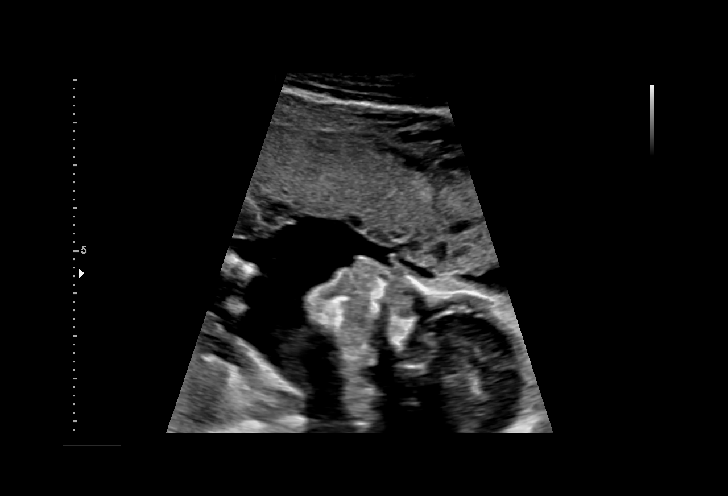
[im 30/59]
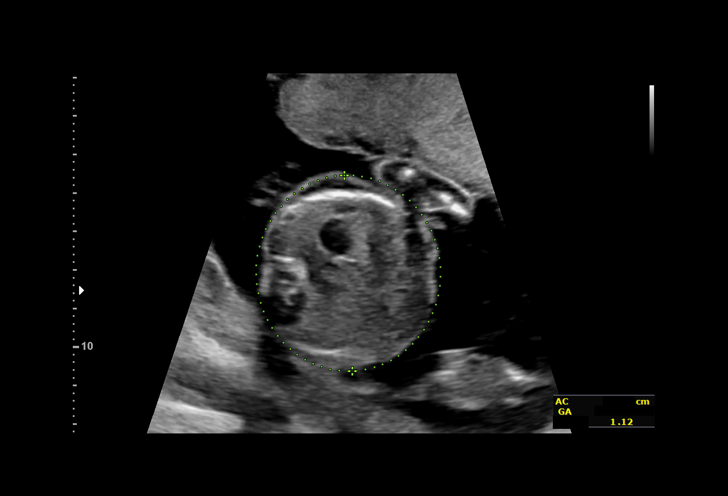
[im 34/59]
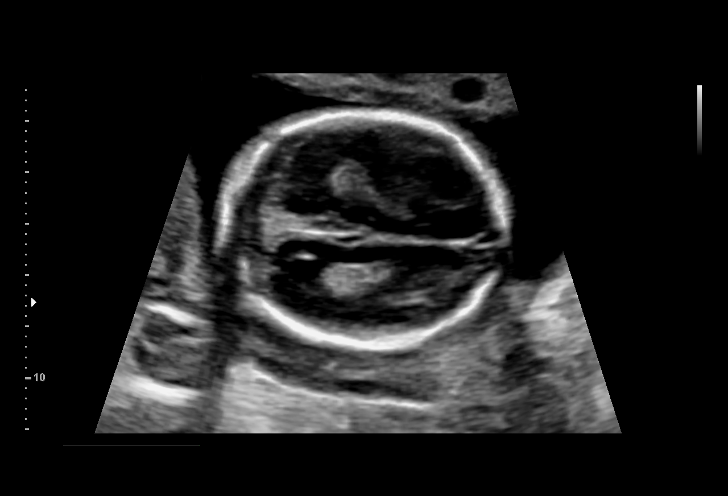
[im 38/59]
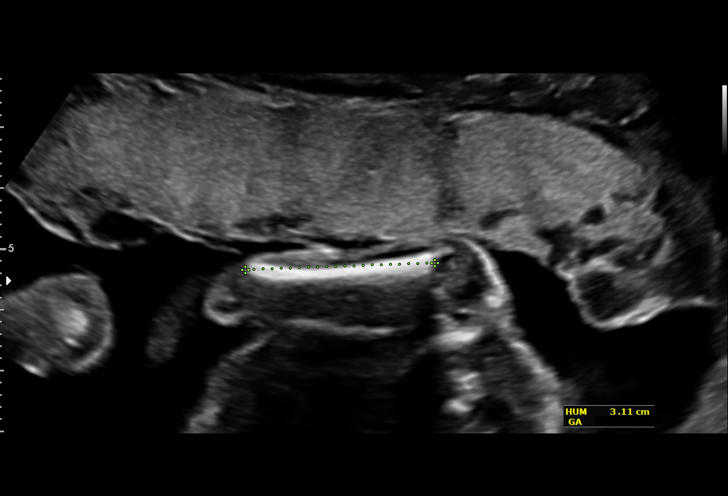
[im 43/59]
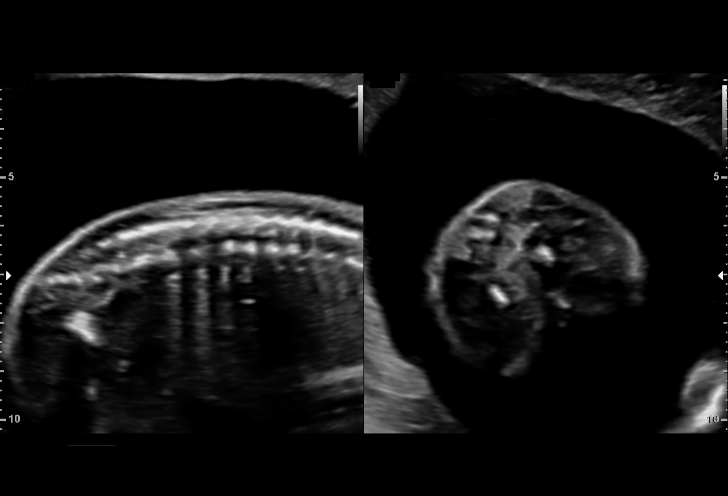
[im 47/59]
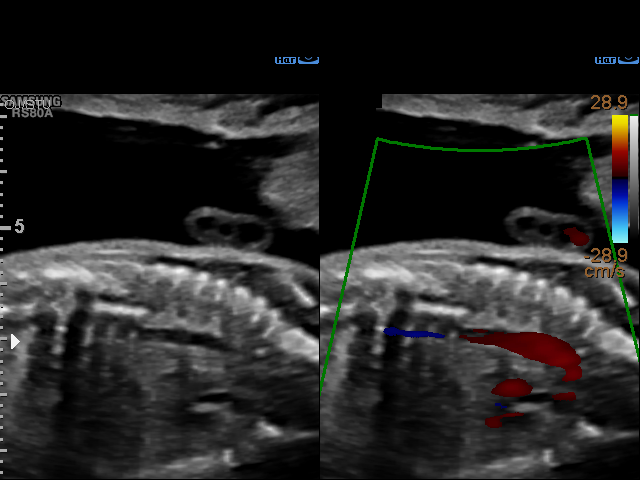
[im 52/59]
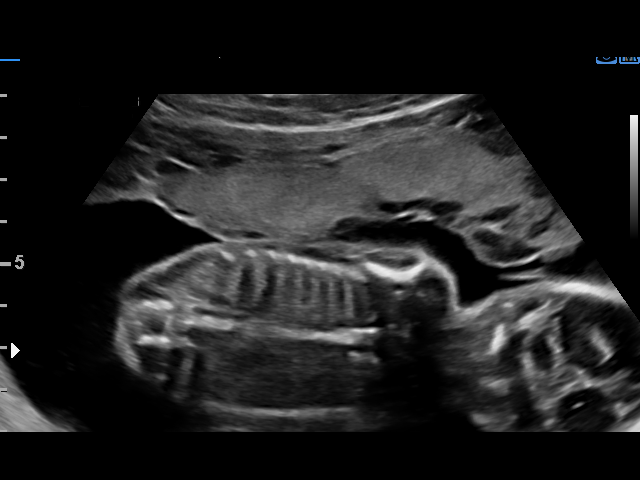
[im 56/59]
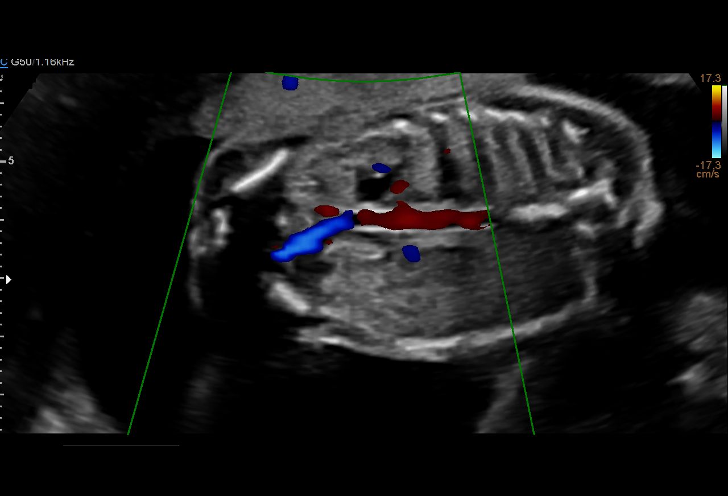

[Series 4: us mfm ob comp +14 wks · 1 of 1 slices shown (2 of 2)]
[im 1/1]
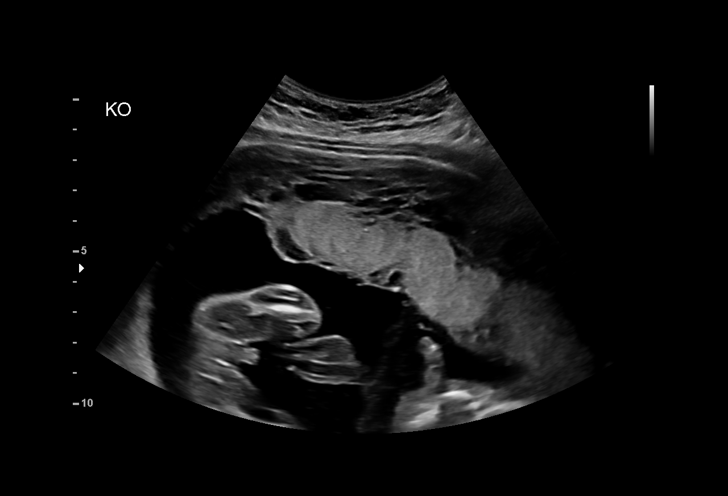

[14 of 28 positions shown; findings below may reference images not displayed]

[facility] Clinic
Ref. Address:     Faculty

1  CHANEL MONGE           [id number]      [phone]     [id number]
Indications

20 weeks gestation of pregnancy
Encounter for antenatal screening for
malformations
Asthma                                         GRR.GR j94.666
OB History

Gravidity:    1         Term:   0        Prem:   0        SAB:   0
TOP:          0       Ectopic:  0        Living: 0
Fetal Evaluation

Num Of Fetuses:     1
Fetal Heart         151
Rate(bpm):
Cardiac Activity:   Observed
Presentation:       Cephalic
Placenta:           Anterior, above cervical os
P. Cord Insertion:  Visualized

Amniotic Fluid
AFI FV:      Subjectively within normal limits

Largest Pocket(cm)
5.23
Gestational Age

LMP:           20w 2d        Date:  04/12/17                 EDD:   01/17/18
Best:          20w 2d     Det. By:  LMP  (04/12/17)          EDD:   01/17/18
Anatomy

Cranium:               Appears normal         Aortic Arch:            Appears normal
Cavum:                 Appears normal         Ductal Arch:            Appears normal
Ventricles:            Appears normal         Diaphragm:              Appears normal
Choroid Plexus:        Appears normal         Stomach:                Appears normal, left
sided
Cerebellum:            Appears normal         Abdomen:                Appears normal
Posterior Fossa:       Appears normal         Abdominal Wall:         Appears nml (cord
insert, abd wall)
Nuchal Fold:           Not applicable (>20    Cord Vessels:           Appears normal (3
wks GA)                                        vessel cord)
Face:                  Appears normal         Kidneys:                Appear normal
(orbits and profile)
Lips:                  Appears normal         Bladder:                Appears normal
Thoracic:              Appears normal         Spine:                  Appears normal
Heart:                 Appears normal         Upper Extremities:      Appears normal
(4CH, axis, and situs
RVOT:                  Appears normal         Lower Extremities:      Appears normal
LVOT:                  Appears normal

Other:  Male gender. Technically difficult due to advanced GA and fetal
position.
Cervix Uterus Adnexa

Cervix
Length:              4  cm.
Normal appearance by transabdominal scan.

Uterus
No abnormality visualized.

Left Ovary
Not visualized. No adnexal mass visualized.

Right Ovary
Not visualized. No adnexal mass visualized.

Cul De Sac:   No free fluid seen.
Impression

SIUP at 20+2 weeks
Normal detailed fetal anatomy
Markers of aneuploidy: none
Normal amniotic fluid volume
Measurements consistent with LMP dating
Recommendations

Follow-up as clinically indicated

## 2018-02-13 ENCOUNTER — Telehealth: Payer: Self-pay | Admitting: Licensed Clinical Social Worker

## 2018-02-13 NOTE — Telephone Encounter (Signed)
CSW A. Nashayla Telleria telephone pt to confirm scheduled appt for 3/25 unable to leave detailed message due phone line constant ringing.

## 2018-02-16 ENCOUNTER — Ambulatory Visit: Payer: Medicaid Other | Admitting: Advanced Practice Midwife

## 2018-02-16 ENCOUNTER — Ambulatory Visit (INDEPENDENT_AMBULATORY_CARE_PROVIDER_SITE_OTHER): Payer: Medicaid Other | Admitting: Advanced Practice Midwife

## 2018-02-16 ENCOUNTER — Encounter: Payer: Self-pay | Admitting: Advanced Practice Midwife

## 2018-02-16 ENCOUNTER — Other Ambulatory Visit: Payer: Self-pay

## 2018-02-16 ENCOUNTER — Other Ambulatory Visit: Payer: Self-pay | Admitting: Family Medicine

## 2018-02-16 VITALS — BP 114/80 | HR 49 | Ht 59.0 in | Wt 170.4 lb

## 2018-02-16 DIAGNOSIS — Z3042 Encounter for surveillance of injectable contraceptive: Secondary | ICD-10-CM | POA: Diagnosis not present

## 2018-02-16 DIAGNOSIS — Z3202 Encounter for pregnancy test, result negative: Secondary | ICD-10-CM

## 2018-02-16 DIAGNOSIS — Z30013 Encounter for initial prescription of injectable contraceptive: Secondary | ICD-10-CM

## 2018-02-16 DIAGNOSIS — Z1389 Encounter for screening for other disorder: Secondary | ICD-10-CM | POA: Diagnosis not present

## 2018-02-16 DIAGNOSIS — J351 Hypertrophy of tonsils: Secondary | ICD-10-CM | POA: Insufficient documentation

## 2018-02-16 LAB — POCT URINE PREGNANCY: PREG TEST UR: NEGATIVE

## 2018-02-16 MED ORDER — MEDROXYPROGESTERONE ACETATE 150 MG/ML IM SUSP
150.0000 mg | Freq: Once | INTRAMUSCULAR | Status: AC
  Administered 2018-02-16: 150 mg via INTRAMUSCULAR

## 2018-02-16 NOTE — Progress Notes (Signed)
UPT today is Negative.   Administrations This Visit    medroxyPROGESTERone (DEPO-PROVERA) injection 150 mg    Admin Date 02/16/2018 Action Given Dose 150 mg Route Intramuscular Administered By Maretta BeesMcGlashan, Carol J, RMA        Next DEPO 6/10-24/19.  Subjective:     Kristie Mcintosh is a 17 y.o. female who presents for a postpartum visit. She is 5 weeks postpartum following a spontaneous vaginal delivery. I have fully reviewed the prenatal and intrapartum course. The delivery was at 38.4 gestational weeks. Outcome: spontaneous vaginal delivery. Anesthesia: epidural. Postpartum course has been Unremarkable. Baby's course has been Unremarkable. Baby is feeding by breast. Bleeding no bleeding. Bowel function is normal. Bladder function is normal. Patient is sexually active. Contraception method is none. Postpartum depression screening: negative.  The following portions of the patient's history were reviewed and updated as appropriate: allergies, current medications, past family history, past medical history, past social history, past surgical history and problem list.  Review of Systems Pertinent items are noted in HPI.   Objective:    BP 114/80   Pulse (!) 49   Ht 4\' 11"  (1.499 m)   Wt 170 lb 6.4 oz (77.3 kg)   Breastfeeding? Yes   BMI 34.42 kg/m   General:  alert, cooperative and no distress   Breasts:  inspection negative, no nipple discharge or bleeding, no masses or nodularity palpable  Lungs: clear to auscultation bilaterally  Heart:  regular rate and rhythm, S1, S2 normal, no murmur, click, rub or gallop  Abdomen: soft, non-tender; bowel sounds normal; no masses,  no organomegaly   Vulva:  not evaluated  Vagina: not evaluated  Cervix:  n/a  Corpus: not examined  Adnexa:  not evaluated  Rectal Exam: Not performed.        Assessment:     Normal  postpartum exam. Pap smear not done at today's visit.   Plan:    1. Contraception: Depo-Provera injections 2. Urine pregnancy  test negative.  OK to give DepoProvera even though she has had unprotrected IC per Dr Clearance CootsHarper 3. Follow up in: 3 months or as needed.

## 2018-02-16 NOTE — Patient Instructions (Signed)

## 2018-05-11 ENCOUNTER — Ambulatory Visit: Payer: Medicaid Other

## 2018-05-18 ENCOUNTER — Ambulatory Visit: Payer: Medicaid Other

## 2018-06-03 ENCOUNTER — Encounter (INDEPENDENT_AMBULATORY_CARE_PROVIDER_SITE_OTHER): Payer: Self-pay

## 2018-07-29 ENCOUNTER — Ambulatory Visit (INDEPENDENT_AMBULATORY_CARE_PROVIDER_SITE_OTHER): Payer: Medicaid Other | Admitting: *Deleted

## 2018-07-29 DIAGNOSIS — Z3202 Encounter for pregnancy test, result negative: Secondary | ICD-10-CM

## 2018-07-29 DIAGNOSIS — Z3200 Encounter for pregnancy test, result unknown: Secondary | ICD-10-CM

## 2018-07-29 LAB — POCT URINE PREGNANCY: Preg Test, Ur: NEGATIVE

## 2018-07-29 NOTE — Progress Notes (Signed)
Kristie Mcintosh presents today for UPT. She has no unusual complaints. Pt concerned that she is not having cycles.  Pt is currently breastfeeding exclusively. Pt states she has only had one cycle of Depo and has been some time since that injection.  OTR:RNHAFBX    OBJECTIVE: Appears well, in no apparent distress.  OB History    Gravida  1   Para  1   Term  1   Preterm  0   AB  0   Living  1     SAB  0   TAB  0   Ectopic  0   Multiple  0   Live Births  1          Home UPT Result: N/A In-Office UPT result: Negative I have reviewed the patient's medical, obstetrical, social, and family histories, and medications.   ASSESSMENT: Negative pregnancy test  PLAN: Pt to follow up in office in 2 weeks for repeat UPT and possible Nexplanon insertion.

## 2018-08-13 ENCOUNTER — Ambulatory Visit: Payer: Medicaid Other | Admitting: Obstetrics and Gynecology

## 2018-08-18 ENCOUNTER — Telehealth: Payer: Self-pay | Admitting: *Deleted

## 2018-08-18 NOTE — Telephone Encounter (Signed)
Lft vmail for patient to call and reschedule missed Nexplanon appointment.Marland Kitchen..Marland Kitchen

## 2019-04-20 ENCOUNTER — Encounter (HOSPITAL_COMMUNITY): Payer: Self-pay

## 2019-04-20 ENCOUNTER — Emergency Department (HOSPITAL_COMMUNITY)
Admission: EM | Admit: 2019-04-20 | Discharge: 2019-04-20 | Disposition: A | Discharge status: Home / Self Care | Payer: Medicaid Other | Attending: Emergency Medicine | Admitting: Emergency Medicine

## 2019-04-20 ENCOUNTER — Emergency Department (HOSPITAL_COMMUNITY): Payer: Medicaid Other

## 2019-04-20 ENCOUNTER — Other Ambulatory Visit: Payer: Self-pay

## 2019-04-20 DIAGNOSIS — R109 Unspecified abdominal pain: Secondary | ICD-10-CM | POA: Diagnosis present

## 2019-04-20 DIAGNOSIS — Z5321 Procedure and treatment not carried out due to patient leaving prior to being seen by health care provider: Secondary | ICD-10-CM | POA: Insufficient documentation

## 2019-04-20 LAB — URINALYSIS, ROUTINE W REFLEX MICROSCOPIC
Glucose, UA: NEGATIVE mg/dL
Hgb urine dipstick: NEGATIVE
Ketones, ur: 15 mg/dL — AB
Leukocytes,Ua: NEGATIVE
Nitrite: NEGATIVE
Protein, ur: 30 mg/dL — AB
Specific Gravity, Urine: 1.01 (ref 1.005–1.030)
pH: 8.5 — ABNORMAL HIGH (ref 5.0–8.0)

## 2019-04-20 LAB — COMPREHENSIVE METABOLIC PANEL
ALT: 14 U/L (ref 0–44)
AST: 20 U/L (ref 15–41)
Albumin: 4.2 g/dL (ref 3.5–5.0)
Alkaline Phosphatase: 115 U/L (ref 38–126)
Anion gap: 8 (ref 5–15)
BUN: 6 mg/dL (ref 6–20)
CO2: 25 mmol/L (ref 22–32)
Calcium: 9.1 mg/dL (ref 8.9–10.3)
Chloride: 103 mmol/L (ref 98–111)
Creatinine, Ser: 0.64 mg/dL (ref 0.44–1.00)
GFR calc Af Amer: >60 mL/min (ref >60)
GFR calc non Af Amer: >60 mL/min (ref >60)
Glucose, Bld: 104 mg/dL — ABNORMAL HIGH (ref 70–99)
Potassium: 4 mmol/L (ref 3.5–5.1)
Sodium: 136 mmol/L (ref 135–145)
Total Bilirubin: 0.7 mg/dL (ref 0.3–1.2)
Total Protein: 7.3 g/dL (ref 6.5–8.1)

## 2019-04-20 LAB — LIPASE, BLOOD: Lipase: 37 U/L (ref 11–51)

## 2019-04-20 LAB — CBC
HCT: 42.7 % (ref 36.0–46.0)
Hemoglobin: 13.8 g/dL (ref 12.0–15.0)
MCH: 28.7 pg (ref 26.0–34.0)
MCHC: 32.3 g/dL (ref 30.0–36.0)
MCV: 88.8 fL (ref 80.0–100.0)
Platelets: 229 10*3/uL (ref 150–400)
RBC: 4.81 MIL/uL (ref 3.87–5.11)
RDW: 12.5 % (ref 11.5–15.5)
WBC: 8.8 10*3/uL (ref 4.0–10.5)
nRBC: 0 % (ref 0.0–0.2)

## 2019-04-20 LAB — URINALYSIS, MICROSCOPIC (REFLEX)

## 2019-04-20 LAB — POC URINE PREG, ED: Preg Test, Ur: NEGATIVE

## 2019-04-20 MED ORDER — IOHEXOL 300 MG/ML  SOLN
100.0000 mL | Freq: Once | INTRAMUSCULAR | Status: AC | PRN
  Administered 2019-04-20: 22:00:00 100 mL via INTRAVENOUS

## 2019-04-20 MED ORDER — SODIUM CHLORIDE 0.9% FLUSH
3.0000 mL | Freq: Once | INTRAVENOUS | Status: AC
  Administered 2019-04-20: 3 mL via INTRAVENOUS

## 2019-04-20 MED ORDER — ONDANSETRON HCL 4 MG/2ML IJ SOLN
4.0000 mg | Freq: Once | INTRAMUSCULAR | Status: AC
  Administered 2019-04-20: 21:00:00 4 mg via INTRAVENOUS
  Filled 2019-04-20: qty 2

## 2019-04-20 MED ORDER — FENTANYL CITRATE (PF) 100 MCG/2ML IJ SOLN
50.0000 ug | INTRAMUSCULAR | Status: DC | PRN
  Administered 2019-04-20: 21:00:00 50 ug via INTRAVENOUS
  Filled 2019-04-20: qty 2

## 2019-04-20 NOTE — ED Notes (Signed)
Pt stated the pain is starting to return, advised her that she received pain meds utilizing the protocol order, so she would need to wait to see the physician for additional pain meds. Pt states "my baby daddy is at home with the baby and doesn't know what to do with the damn thing, I need to go home". Advised pt she should stay for evaluation because pain is returning. Pt stated she was unable to do so. IV access removed, pt encouraged to return for further evaluation.

## 2019-04-20 NOTE — ED Triage Notes (Signed)
Pt arrives POV for eval of acute RLQ abd pain. Pt reports generalized abd pain this AM, localized to RLQ later this evening. Pt is exquisitely uncomfortable, reports rebound tenderness. +vomiting.

## 2019-04-20 NOTE — ED Notes (Signed)
Not answering for CT

## 2019-08-03 ENCOUNTER — Emergency Department (HOSPITAL_COMMUNITY)
Admission: EM | Admit: 2019-08-03 | Discharge: 2019-08-03 | Disposition: A | Discharge status: Home / Self Care | Payer: Medicaid Other | Attending: Emergency Medicine | Admitting: Emergency Medicine

## 2019-08-03 ENCOUNTER — Encounter (HOSPITAL_COMMUNITY): Payer: Self-pay

## 2019-08-03 ENCOUNTER — Other Ambulatory Visit: Payer: Self-pay

## 2019-08-03 DIAGNOSIS — R112 Nausea with vomiting, unspecified: Secondary | ICD-10-CM | POA: Insufficient documentation

## 2019-08-03 DIAGNOSIS — R197 Diarrhea, unspecified: Secondary | ICD-10-CM | POA: Insufficient documentation

## 2019-08-03 DIAGNOSIS — J45909 Unspecified asthma, uncomplicated: Secondary | ICD-10-CM | POA: Insufficient documentation

## 2019-08-03 DIAGNOSIS — R109 Unspecified abdominal pain: Secondary | ICD-10-CM

## 2019-08-03 DIAGNOSIS — Z87891 Personal history of nicotine dependence: Secondary | ICD-10-CM | POA: Insufficient documentation

## 2019-08-03 DIAGNOSIS — Z79899 Other long term (current) drug therapy: Secondary | ICD-10-CM | POA: Insufficient documentation

## 2019-08-03 DIAGNOSIS — R1084 Generalized abdominal pain: Secondary | ICD-10-CM | POA: Insufficient documentation

## 2019-08-03 DIAGNOSIS — Z91010 Allergy to peanuts: Secondary | ICD-10-CM | POA: Insufficient documentation

## 2019-08-03 LAB — I-STAT BETA HCG BLOOD, ED (MC, WL, AP ONLY): I-stat hCG, quantitative: <5 m[IU]/mL (ref <5)

## 2019-08-03 MED ORDER — SODIUM CHLORIDE 0.9% FLUSH: 3.0000 mL | Freq: Once | INTRAVENOUS | Status: DC

## 2019-08-03 NOTE — ED Triage Notes (Addendum)
Patient c/o lower abdominal pain since last night. Patient states she has had this happen before,but no one told her what was going on. Patient states she has had V/D this AM. Patient walked into triage with a [employer] food bag.  Patient ate fries and milkshake prior to leaving triage.

## 2019-08-03 NOTE — Discharge Instructions (Addendum)
It was our pleasure to provide your ER care today - we hope that you feel better.  Rest. Drink plenty of fluids.  Return to ER if worse, new symptoms, fevers, worsening or severe pain, persistent vomiting, other concern.

## 2019-08-03 NOTE — ED Provider Notes (Signed)
[facility]-EMERGENCY DEPT Provider Note   CSN: [id number] Arrival date & time: 08/03/19  1114     History   Chief Complaint Chief Complaint  Patient presents with  . Abdominal Pain    HPI Kristie Mcintosh is a 18 y.o. female.     Patient c/o acute onset mid/diffuse abd cramping, and episode of nausea/vomiting/diarrhea this AM. Symptoms acute onset, moderate, now improved. No current abd pain. No back or flank pain. No dysuria or hematuria. No vaginal discharge or bleeding. LNMP 1 week ago. Denies known bad food ingestion or ill contacts. Normal appetite. No fever or chills.   The history is provided by the patient.  Abdominal Pain Associated symptoms: diarrhea and vomiting   Associated symptoms: no chest pain, no chills, no dysuria, no fever, no shortness of breath, no sore throat, no vaginal bleeding and no vaginal discharge     Past Medical History:  Diagnosis Date  . Abdominal pain affecting pregnancy 05/16/2017  . Amenorrhea 05/16/2017  . Asthma   . Bacterial vaginitis 05/16/2017  . Headache(784.0)   . Obesity     Patient Active Problem List   Diagnosis Date Noted  . Enlarged tonsils 02/16/2018  . Normal labor and delivery 01/07/2018  . Bacterial vaginitis 05/16/2017  . Tension headache 08/11/2013  . Migraine without aura and without status migrainosus, not intractable 08/11/2013  . Insomnia 08/11/2013  . 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 ADD (attention deficit disorder) 08/11/2013  . Obesity (BMI 30-39.9) 08/11/2013    Past Surgical History:  Procedure Laterality Date  . ADENOIDECTOMY AND MYRINGOTOMY WITH TUBE PLACEMENT Bilateral 2001  . WISDOM TOOTH EXTRACTION Bilateral      OB History    Gravida  1   Para  1   Term  1   Preterm  0   AB  0   Living  1     SAB  0   TAB  0   Ectopic  0   Multiple  0   Live Births  1            Home Medications    Prior to Admission  medications   Medication Sig Start Date End Date Taking? Authorizing Provider  beclomethasone (QVAR REDIHALER) 40 MCG/ACT inhaler Inhale 2 puffs into the lungs 2 (two) times daily. Patient not taking: Reported on 11/21/2017 08/28/17   Orvilla Cornwallenney, Rachelle A, CNM  ibuprofen (ADVIL,MOTRIN) 600 MG tablet Take 1 tablet (600 mg total) by mouth every 6 (six) hours. Patient not taking: Reported on 02/16/2018 01/09/18   Oralia ManisAbraham, Sherin, DO  Prenatal-Fe Fum-Methf-FA w/o A (VITAFOL-NANO) 18-0.6-0.4 MG TABS Take 1 tablet by mouth daily. 09/25/17   Denney, Rachelle A, CNM  senna-docusate (SENOKOT-S) 8.6-50 MG tablet Take 2 tablets by mouth daily. 01/10/18   Oralia ManisAbraham, Sherin, DO    Family History Family History  Problem Relation Age of Onset  . Headache Mother   . Heart disease Mother   . Migraines Maternal Grandmother   . Depression Maternal Grandmother   . Heart Problems Maternal Grandfather   . Bipolar disorder Paternal Aunt   . Autism Cousin        2 Maternal 1st Cousins have Autism  . Hyperlipidemia Other   . Diabetes Other   . Heart disease Other   . Hypertension Other     Social History Social History   Tobacco Use  . Smoking status: Former Smoker    Packs/day: 0.25  Types: Cigarettes    Quit date: 04/25/2017    Years since quitting: 2.2  . Smokeless tobacco: Never Used  Substance Use Topics  . Alcohol use: No  . Drug use: Yes    Types: Marijuana     Allergies   Eggs or egg-derived products, Peanuts [peanut oil], and Penicillin g   Review of Systems Review of Systems  Constitutional: Negative for chills and fever.  HENT: Negative for sore throat.   Eyes: Negative for redness.  Respiratory: Negative for shortness of breath.   Cardiovascular: Negative for chest pain.  Gastrointestinal: Positive for abdominal pain, diarrhea and vomiting.  Genitourinary: Negative for dysuria, flank pain, vaginal bleeding and vaginal discharge.  Musculoskeletal: Negative for back pain.  Skin:  Negative for rash.  Neurological: Negative for headaches.  Hematological: Does not bruise/bleed easily.  Psychiatric/Behavioral: Negative for confusion.     Physical Exam Updated Vital Signs BP 116/80 (BP Location: Left Arm)   Pulse 67   Temp 98.7 F (37.1 C) (Oral)   Resp 14   Ht 1.499 m (4\' 11" )   Wt 72.4 kg   LMP 08/02/2019 (Approximate)   SpO2 100%   BMI 32.24 kg/m   Physical Exam Vitals signs and nursing note reviewed.  Constitutional:      Appearance: Normal appearance. She is well-developed.  HENT:     Head: Atraumatic.     Nose: Nose normal.     Mouth/Throat:     Mouth: Mucous membranes are moist.  Eyes:     General: No scleral icterus.    Conjunctiva/sclera: Conjunctivae normal.  Neck:     Musculoskeletal: Normal range of motion and neck supple. No neck rigidity or muscular tenderness.     Trachea: No tracheal deviation.  Cardiovascular:     Rate and Rhythm: Normal rate and regular rhythm.     Pulses: Normal pulses.     Heart sounds: Normal heart sounds. No murmur. No friction rub. No gallop.   Pulmonary:     Effort: Pulmonary effort is normal. No respiratory distress.     Breath sounds: Normal breath sounds.  Abdominal:     General: Bowel sounds are normal. There is no distension.     Palpations: Abdomen is soft. There is no mass.     Tenderness: There is no abdominal tenderness. There is no guarding or rebound.     Hernia: No hernia is present.     Comments: No incarcerated hernia.   Genitourinary:    Comments: No cva tenderness.  Musculoskeletal:        General: No swelling.  Skin:    General: Skin is warm and dry.     Findings: No rash.  Neurological:     Mental Status: She is alert.     Comments: Alert, speech normal.   Psychiatric:        Mood and Affect: Mood normal.      ED Treatments / Results  Labs (all labs ordered are listed, but only abnormal results are displayed) Results for orders placed or performed during the hospital  encounter of 08/03/19  I-Stat beta hCG blood, ED  Result Value Ref Range   I-stat hCG, quantitative <5.0 <5 mIU/mL   Comment 3            EKG None  Radiology No results found.  Procedures Procedures (including critical care time)  Medications Ordered in ED Medications - No data to display   Initial Impression / Assessment and Plan / ED Course  I have reviewed the triage vital signs and the nursing notes.  Pertinent labs & imaging results that were available during my care of the patient were reviewed by me and considered in my medical decision making (see chart for details).  Lab ordered from triage.   Reviewed nursing notes and prior charts for additional history.   Patient has eaten [employer] shake/fries prior to placement into room. Is currently drinking fluids.  No recurrent nausea, vomiting or diarrhea.   Abd is soft and non tender.   Labs reviewed - preg neg.   Patient currently appears stable for d/c.   Return precautions provided.     Final Clinical Impressions(s) / ED Diagnoses   Final diagnoses:  None    ED Discharge Orders    None       Lajean Saver, MD 08/03/19 1231

## 2019-08-30 IMAGING — CT CT ABDOMEN AND PELVIS WITH CONTRAST
2 of 4 series · 17 of 46 positions shown, 19 images · IV contrast (omnipaque)
Comparison: None.

CLINICAL DATA: Right lower quadrant pain for several hours

EXAM:
CT ABDOMEN AND PELVIS WITH CONTRAST
TECHNIQUE: Multidetector CT imaging of the abdomen and pelvis was performed
using the standard protocol following bolus administration of
intravenous contrast.
CONTRAST:  100mL OMNIPAQUE 300

[Series 3: abdomen 5.0 · axial · 0.77mm/px · z∈[-598,-178]mm · 14 of 96 slices shown, 16 images]
[im 6/96  soft-tissue]
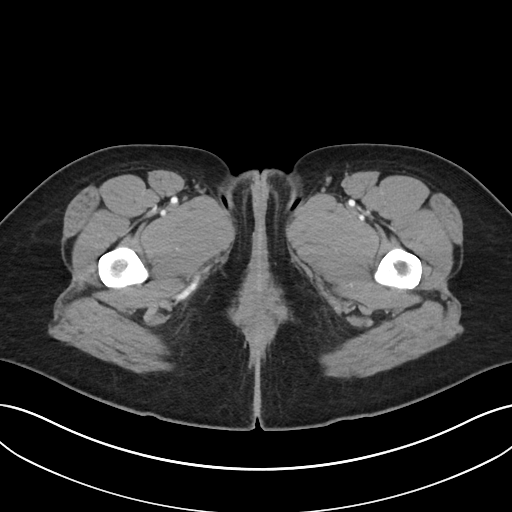
[im 6/96  bone]
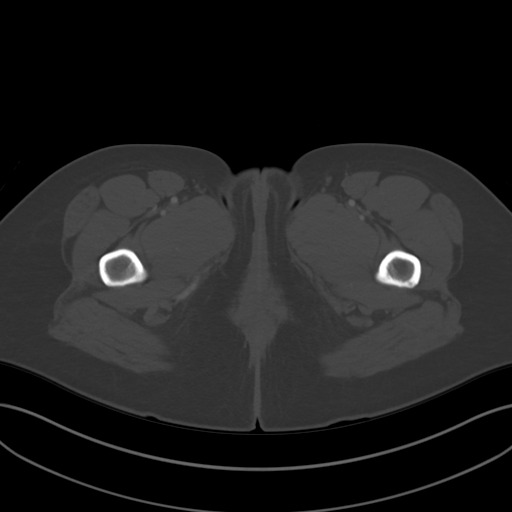
[im 12/96  soft-tissue]
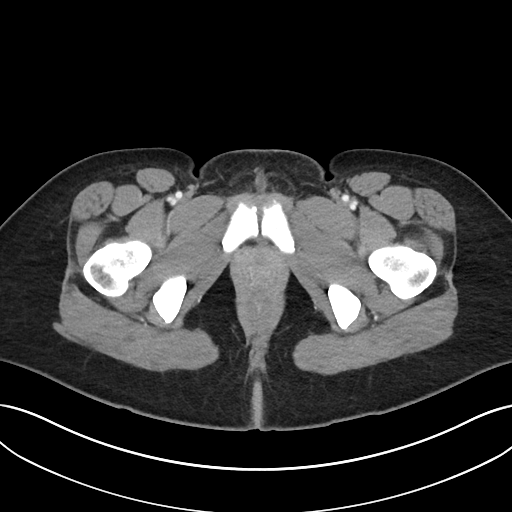
[im 18/96  soft-tissue]
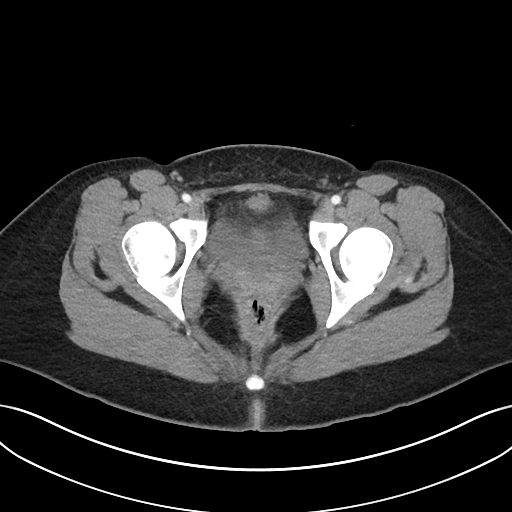
[im 24/96  soft-tissue]
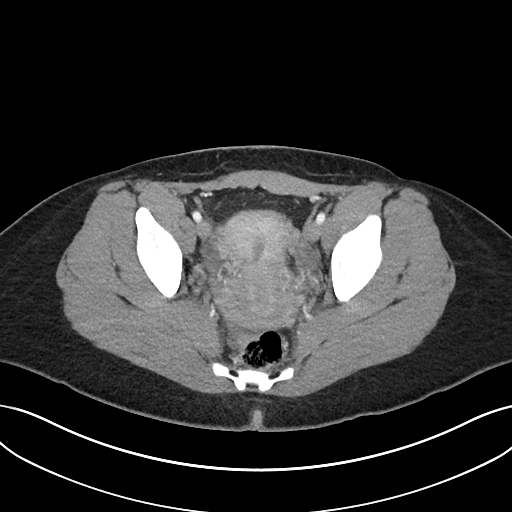
[im 30/96  soft-tissue]
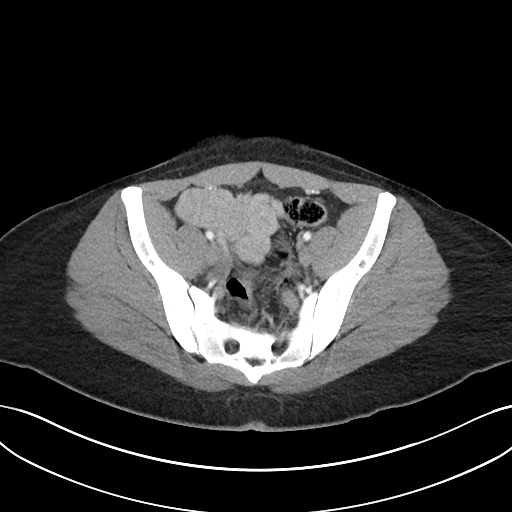
[im 36/96  soft-tissue]
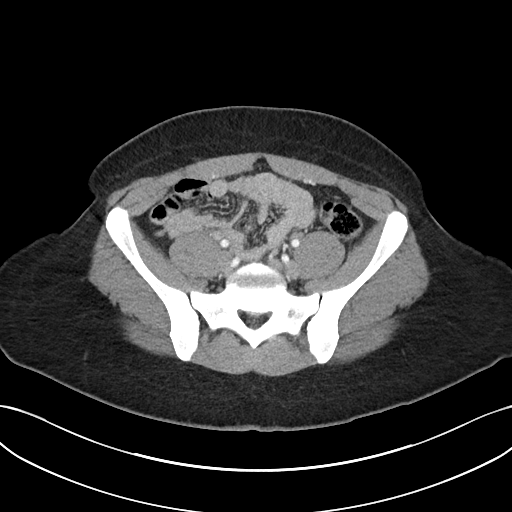
[im 42/96  soft-tissue]
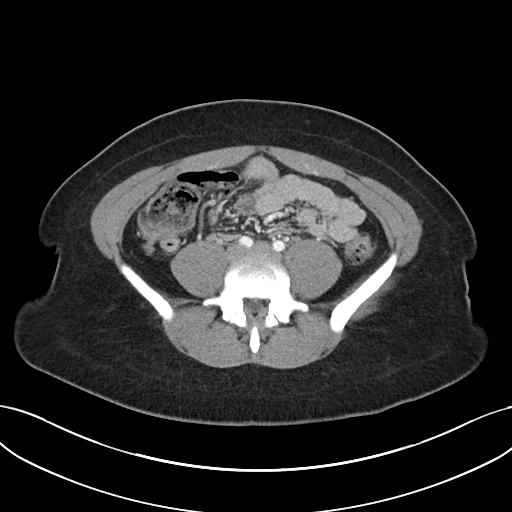
[im 54/96  soft-tissue]
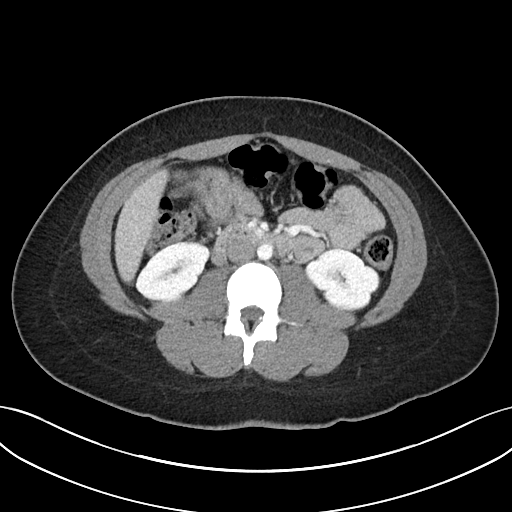
[im 60/96  soft-tissue]
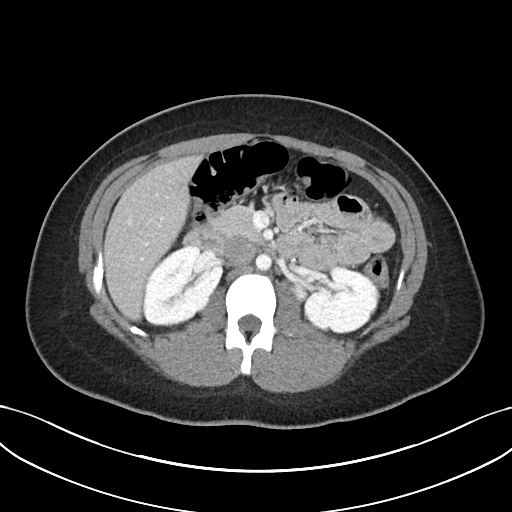
[im 60/96  bone]
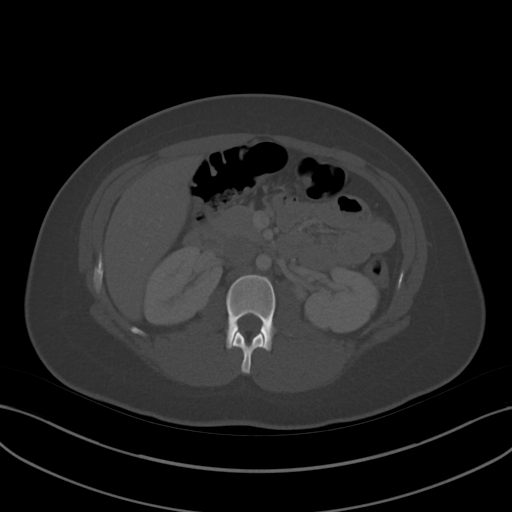
[im 66/96  soft-tissue]
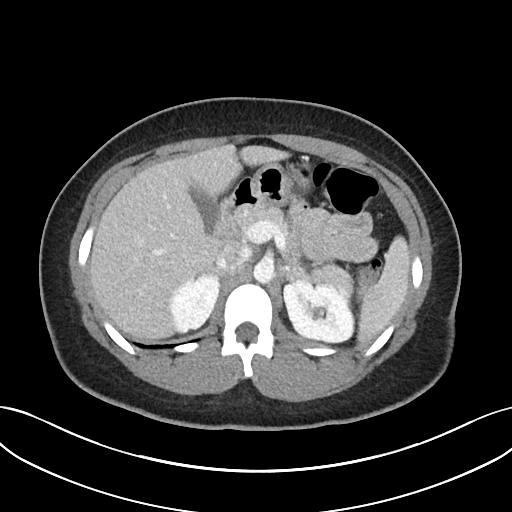
[im 72/96  soft-tissue]
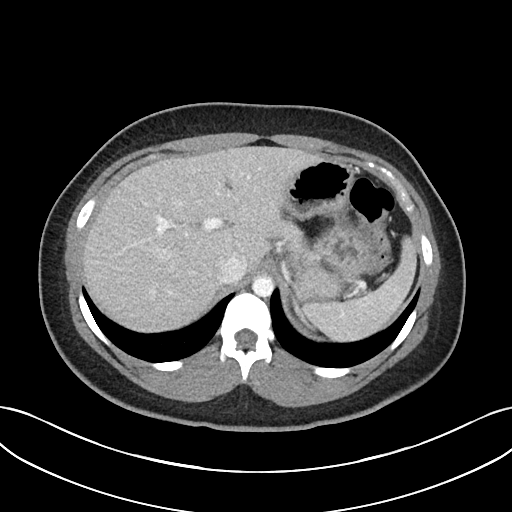
[im 78/96  soft-tissue]
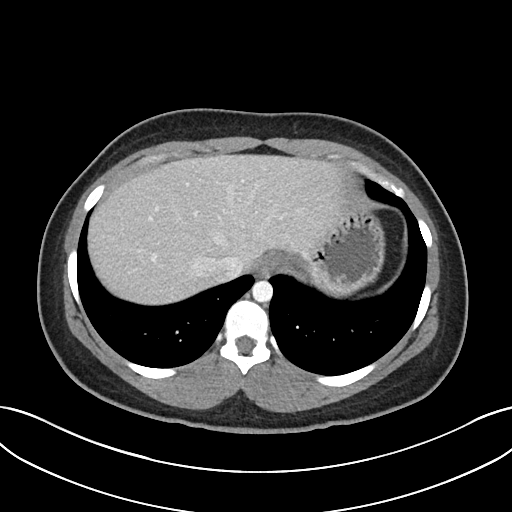
[im 84/96  soft-tissue]
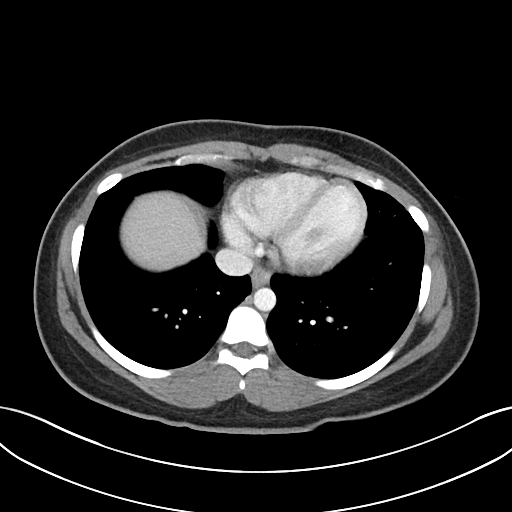
[im 90/96  soft-tissue]
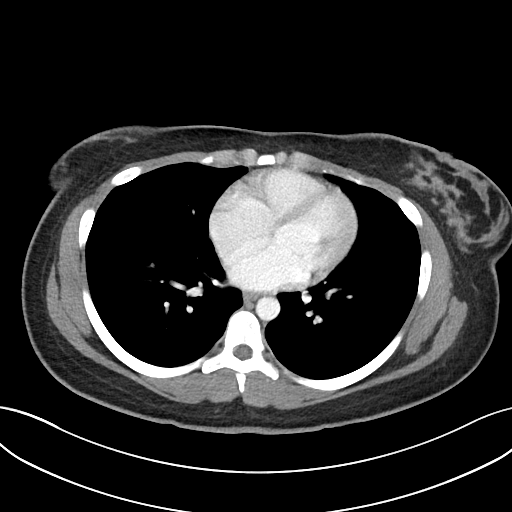

[Series 6: abdomen 3.0 mpr cor · coronal · 0.81mm/px · 3 of 100 slices shown]
[im 34/100  soft-tissue]
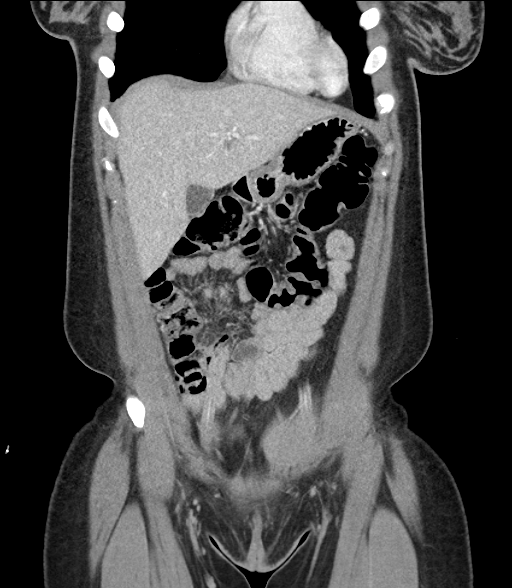
[im 45/100  soft-tissue]
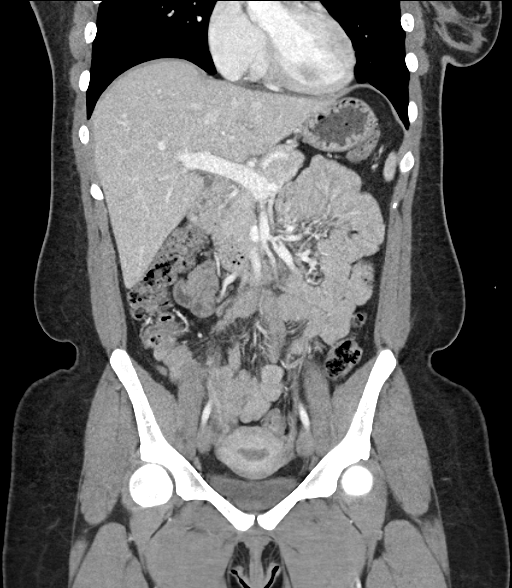
[im 56/100  soft-tissue]
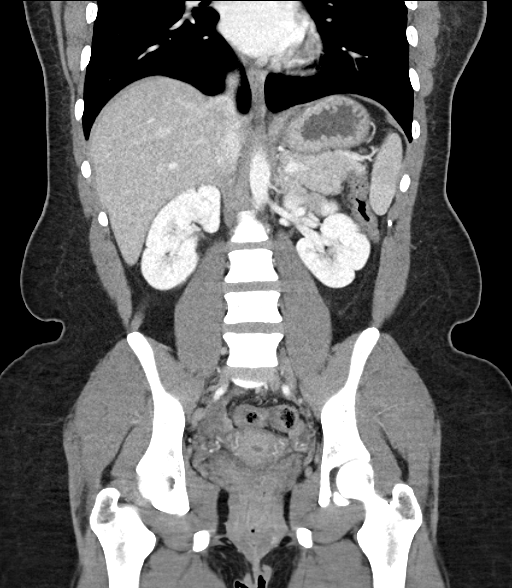

[17 of 46 positions shown; findings below may reference images not displayed]

FINDINGS: Lower chest: No acute abnormality.

Hepatobiliary: No focal liver abnormality is seen. No gallstones,
gallbladder wall thickening, or biliary dilatation.

Pancreas: Unremarkable. No pancreatic ductal dilatation or
surrounding inflammatory changes.

Spleen: Normal in size without focal abnormality.

Adrenals/Urinary Tract: Adrenal glands are within normal limits.
Kidneys are well visualized bilaterally without renal calculi or
obstructive changes. Bladder is decompressed.

Stomach/Bowel: The appendix is within normal limits. The large and
small bowel are well visualized without inflammatory or obstructive
changes. Stomach is unremarkable.

Vascular/Lymphatic: No significant vascular findings are present. No
enlarged abdominal or pelvic lymph nodes.

Reproductive: Uterus and bilateral adnexa are unremarkable.

Other: No abdominal wall hernia or abnormality. No abdominopelvic
ascites.

Musculoskeletal: No acute or significant osseous findings.
IMPRESSION: No acute abnormality noted.

Normal-appearing appendix.

## 2019-10-04 ENCOUNTER — Emergency Department (HOSPITAL_COMMUNITY)
Admission: EM | Admit: 2019-10-04 | Discharge: 2019-10-04 | Disposition: A | Discharge status: Home / Self Care | Payer: Self-pay | Attending: Emergency Medicine | Admitting: Emergency Medicine

## 2019-10-04 ENCOUNTER — Encounter (HOSPITAL_COMMUNITY): Payer: Self-pay | Admitting: Emergency Medicine

## 2019-10-04 DIAGNOSIS — Z79899 Other long term (current) drug therapy: Secondary | ICD-10-CM | POA: Insufficient documentation

## 2019-10-04 DIAGNOSIS — R1084 Generalized abdominal pain: Secondary | ICD-10-CM | POA: Insufficient documentation

## 2019-10-04 DIAGNOSIS — F1210 Cannabis abuse, uncomplicated: Secondary | ICD-10-CM | POA: Insufficient documentation

## 2019-10-04 DIAGNOSIS — Z87891 Personal history of nicotine dependence: Secondary | ICD-10-CM | POA: Insufficient documentation

## 2019-10-04 DIAGNOSIS — R112 Nausea with vomiting, unspecified: Secondary | ICD-10-CM | POA: Insufficient documentation

## 2019-10-04 DIAGNOSIS — J45909 Unspecified asthma, uncomplicated: Secondary | ICD-10-CM | POA: Insufficient documentation

## 2019-10-04 LAB — LIPASE, BLOOD: Lipase: 36 U/L (ref 11–51)

## 2019-10-04 LAB — CBC WITH DIFFERENTIAL/PLATELET
Abs Immature Granulocytes: 0.02 10*3/uL (ref 0.00–0.07)
Basophils Absolute: 0 10*3/uL (ref 0.0–0.1)
Basophils Relative: 0 %
Eosinophils Absolute: 0.1 10*3/uL (ref 0.0–0.5)
Eosinophils Relative: 1 %
HCT: 44.9 % (ref 36.0–46.0)
Hemoglobin: 14.3 g/dL (ref 12.0–15.0)
Immature Granulocytes: 0 %
Lymphocytes Relative: 19 %
Lymphs Abs: 1.7 10*3/uL (ref 0.7–4.0)
MCH: 29.6 pg (ref 26.0–34.0)
MCHC: 31.8 g/dL (ref 30.0–36.0)
MCV: 93 fL (ref 80.0–100.0)
Monocytes Absolute: 0.5 10*3/uL (ref 0.1–1.0)
Monocytes Relative: 5 %
Neutro Abs: 7.1 10*3/uL (ref 1.7–7.7)
Neutrophils Relative %: 75 %
Platelets: 229 10*3/uL (ref 150–400)
RBC: 4.83 MIL/uL (ref 3.87–5.11)
RDW: 12.7 % (ref 11.5–15.5)
WBC: 9.4 10*3/uL (ref 4.0–10.5)
nRBC: 0 % (ref 0.0–0.2)

## 2019-10-04 LAB — COMPREHENSIVE METABOLIC PANEL
ALT: 17 U/L (ref 0–44)
AST: 20 U/L (ref 15–41)
Albumin: 4.4 g/dL (ref 3.5–5.0)
Alkaline Phosphatase: 80 U/L (ref 38–126)
Anion gap: 11 (ref 5–15)
BUN: 9 mg/dL (ref 6–20)
CO2: 22 mmol/L (ref 22–32)
Calcium: 9.5 mg/dL (ref 8.9–10.3)
Chloride: 106 mmol/L (ref 98–111)
Creatinine, Ser: 0.67 mg/dL (ref 0.44–1.00)
GFR calc Af Amer: >60 mL/min (ref >60)
GFR calc non Af Amer: >60 mL/min (ref >60)
Glucose, Bld: 116 mg/dL — ABNORMAL HIGH (ref 70–99)
Potassium: 3.7 mmol/L (ref 3.5–5.1)
Sodium: 139 mmol/L (ref 135–145)
Total Bilirubin: 0.3 mg/dL (ref 0.3–1.2)
Total Protein: 7.4 g/dL (ref 6.5–8.1)

## 2019-10-04 LAB — I-STAT BETA HCG BLOOD, ED (MC, WL, AP ONLY): I-stat hCG, quantitative: <5 m[IU]/mL (ref <5)

## 2019-10-04 MED ORDER — DICYCLOMINE HCL 10 MG/ML IM SOLN
20.0000 mg | Freq: Once | INTRAMUSCULAR | Status: AC
  Administered 2019-10-04: 20 mg via INTRAMUSCULAR
  Filled 2019-10-04: qty 2

## 2019-10-04 MED ORDER — DICYCLOMINE HCL 20 MG PO TABS: 20.0000 mg | ORAL_TABLET | Freq: Two times a day (BID) | ORAL | 0 refills | Status: DC

## 2019-10-04 MED ORDER — SODIUM CHLORIDE 0.9 % IV BOLUS
1000.0000 mL | Freq: Once | INTRAVENOUS | Status: AC
  Administered 2019-10-04: 1000 mL via INTRAVENOUS

## 2019-10-04 MED ORDER — FAMOTIDINE 20 MG PO TABS: 20.0000 mg | ORAL_TABLET | Freq: Two times a day (BID) | ORAL | 0 refills | Status: DC

## 2019-10-04 MED ORDER — ONDANSETRON 4 MG PO TBDP: ORAL_TABLET | ORAL | 0 refills | Status: DC

## 2019-10-04 MED ORDER — ONDANSETRON 4 MG PO TBDP
4.0000 mg | ORAL_TABLET | Freq: Once | ORAL | Status: AC
  Administered 2019-10-04: 4 mg via ORAL
  Filled 2019-10-04: qty 1

## 2019-10-04 MED ORDER — METOCLOPRAMIDE HCL 5 MG/ML IJ SOLN
10.0000 mg | Freq: Once | INTRAMUSCULAR | Status: AC
  Administered 2019-10-04: 10 mg via INTRAVENOUS
  Filled 2019-10-04: qty 2

## 2019-10-04 MED ORDER — FAMOTIDINE IN NACL 20-0.9 MG/50ML-% IV SOLN
20.0000 mg | Freq: Once | INTRAVENOUS | Status: AC
  Administered 2019-10-04: 13:00:00 20 mg via INTRAVENOUS
  Filled 2019-10-04: qty 50

## 2019-10-04 MED ORDER — OXYCODONE-ACETAMINOPHEN 5-325 MG PO TABS
1.0000 | ORAL_TABLET | Freq: Once | ORAL | Status: AC
  Administered 2019-10-04: 1 via ORAL
  Filled 2019-10-04: qty 1

## 2019-10-04 NOTE — ED Provider Notes (Signed)
[facility] Provider Note   CSN: [id number] Arrival date & time: 10/04/19  1030     History   Chief Complaint Chief Complaint  Patient presents with  . Emesis  . Abdominal Pain    HPI Kristie Mcintosh is a 18 y.o. female.     Kristie Mcintosh is a 18 y.o. female with a history of asthma, obesity, and headaches, who presents to the ED for evaluation of abdominal pain, nausea and vomiting.  She reports abdominal pain is generalized but is worse on the right side.  Patient has been seen for similar in the past, had previous CT scan in May which was unremarkable with similar right-sided pain.  Reports that she has had multiple episodes of vomiting this morning and has been unable to keep any fluid or fluids down.  Symptoms began this morning when the patient woke up.  She denies any diarrhea, constipation or blood in the stool.  No chest pain or shortness of breath.  No cough.  No fevers or chills.  No dysuria.  No vaginal discharge or bleeding.  Does report marijuana use, denies alcohol use.     Past Medical History:  Diagnosis Date  . Abdominal pain affecting pregnancy 05/16/2017  . Amenorrhea 05/16/2017  . Asthma   . Bacterial vaginitis 05/16/2017  . Headache(784.0)   . Obesity     Patient Active Problem List   Diagnosis Date Noted  . Enlarged tonsils 02/16/2018  . Normal labor and delivery 01/07/2018  . Bacterial vaginitis 05/16/2017  . Tension headache 08/11/2013  . Migraine without aura and without status migrainosus, not intractable 08/11/2013  . Insomnia 08/11/2013  . 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 ADD (attention deficit disorder) 08/11/2013  . Obesity (BMI 30-39.9) 08/11/2013    Past Surgical History:  Procedure Laterality Date  . ADENOIDECTOMY AND MYRINGOTOMY WITH TUBE PLACEMENT Bilateral 2001  . WISDOM TOOTH EXTRACTION Bilateral      OB History    Gravida  1   Para  1   Term   1   Preterm  0   AB  0   Living  1     SAB  0   TAB  0   Ectopic  0   Multiple  0   Live Births  1            Home Medications    Prior to Admission medications   Medication Sig Start Date End Date Taking? Authorizing Provider  beclomethasone (QVAR REDIHALER) 40 MCG/ACT inhaler Inhale 2 puffs into the lungs 2 (two) times daily. Patient not taking: Reported on 11/21/2017 08/28/17   Orvilla Cornwallenney, Rachelle A, CNM  ibuprofen (ADVIL,MOTRIN) 600 MG tablet Take 1 tablet (600 mg total) by mouth every 6 (six) hours. Patient not taking: Reported on 02/16/2018 01/09/18   Oralia ManisAbraham, Sherin, DO  Prenatal-Fe Fum-Methf-FA w/o A (VITAFOL-NANO) 18-0.6-0.4 MG TABS Take 1 tablet by mouth daily. Patient not taking: Reported on 08/03/2019 09/25/17   Orvilla Cornwallenney, Rachelle A, CNM  senna-docusate (SENOKOT-S) 8.6-50 MG tablet Take 2 tablets by mouth daily. Patient not taking: Reported on 08/03/2019 01/10/18   Oralia ManisAbraham, Sherin, DO    Family History Family History  Problem Relation Age of Onset  . Headache Mother   . Heart disease Mother   . Migraines Maternal Grandmother   . Depression Maternal Grandmother   . Heart Problems Maternal Grandfather   . Bipolar disorder Paternal Aunt   .  Autism Cousin        2 Maternal 1st Cousins have Autism  . Hyperlipidemia Other   . Diabetes Other   . Heart disease Other   . Hypertension Other     Social History Social History   Tobacco Use  . Smoking status: Former Smoker    Packs/day: 0.25    Types: Cigarettes    Quit date: 04/25/2017    Years since quitting: 2.4  . Smokeless tobacco: Never Used  Substance Use Topics  . Alcohol use: No  . Drug use: Yes    Types: Marijuana     Allergies   Eggs or egg-derived products, Peanuts [peanut oil], and Penicillin g   Review of Systems Review of Systems  Constitutional: Negative for chills and fever.  HENT: Negative.   Eyes: Negative for visual disturbance.  Respiratory: Negative for cough and shortness of  breath.   Cardiovascular: Negative for chest pain.  Gastrointestinal: Positive for abdominal pain, nausea and vomiting. Negative for blood in stool, constipation and diarrhea.  Genitourinary: Negative for dysuria, frequency, vaginal bleeding and vaginal discharge.  Musculoskeletal: Negative for arthralgias and myalgias.  Skin: Negative for color change and rash.  Neurological: Negative for dizziness, syncope and light-headedness.     Physical Exam Updated Vital Signs BP 114/87 (BP Location: Right Arm)   Pulse (!) 58   Temp 98.3 F (36.8 C) (Oral)   Resp 20   SpO2 97%   Physical Exam Vitals signs and nursing note reviewed.  Constitutional:      General: She is not in acute distress.    Appearance: She is well-developed. She is not ill-appearing or diaphoretic.  HENT:     Head: Normocephalic and atraumatic.  Eyes:     General:        Right eye: No discharge.        Left eye: No discharge.     Pupils: Pupils are equal, round, and reactive to light.  Neck:     Musculoskeletal: Neck supple.  Cardiovascular:     Rate and Rhythm: Normal rate and regular rhythm.     Heart sounds: Normal heart sounds. No murmur. No friction rub. No gallop.   Pulmonary:     Effort: Pulmonary effort is normal. No respiratory distress.     Breath sounds: Normal breath sounds. No wheezing or rales.     Comments: Respirations equal and unlabored, patient able to speak in full sentences, lungs clear to auscultation bilaterally Abdominal:     General: Bowel sounds are normal. There is no distension.     Palpations: Abdomen is soft. There is no mass.     Tenderness: There is generalized abdominal tenderness. There is no guarding.     Comments: Abdomen soft, nondistended, bowel sounds present throughout, there is generalized tenderness throughout the abdomen without guarding or rebound tenderness, no peritoneal signs, no focal area of pain.  Musculoskeletal:        General: No deformity.  Skin:     General: Skin is warm and dry.     Capillary Refill: Capillary refill takes less than 2 seconds.  Neurological:     Mental Status: She is alert.     Coordination: Coordination normal.     Comments: Speech is clear, able to follow commands Moves extremities without ataxia, coordination intact  Psychiatric:        Mood and Affect: Mood normal.        Behavior: Behavior normal.      ED Treatments /  Results  Labs (all labs ordered are listed, but only abnormal results are displayed) Labs Reviewed  COMPREHENSIVE METABOLIC PANEL - Abnormal; Notable for the following components:      Result Value   Glucose, Bld 116 (*)    All other components within normal limits  LIPASE, BLOOD  CBC WITH DIFFERENTIAL/PLATELET  URINALYSIS, ROUTINE W REFLEX MICROSCOPIC  I-STAT BETA HCG BLOOD, ED ([facility], [facility], [facility] ONLY)    EKG None  Radiology No results found.  Procedures Procedures (including critical care time)  Medications Ordered in ED Medications  ondansetron (ZOFRAN-ODT) disintegrating tablet 4 mg (4 mg Oral Given 10/04/19 1107)  oxyCODONE-acetaminophen (PERCOCET/ROXICET) 5-325 MG per tablet 1 tablet (1 tablet Oral Given 10/04/19 1120)  sodium chloride 0.9 % bolus 1,000 mL (1,000 mLs Intravenous New Bag/Given 10/04/19 1247)  famotidine (PEPCID) IVPB 20 mg premix (20 mg Intravenous New Bag/Given 10/04/19 1247)  metoCLOPramide (REGLAN) injection 10 mg (10 mg Intravenous Given 10/04/19 1247)  dicyclomine (BENTYL) injection 20 mg (20 mg Intramuscular Given 10/04/19 1248)     Initial Impression / Assessment and Plan / ED Course  I have reviewed the triage vital signs and the nursing notes.  Pertinent labs & imaging results that were available during my care of the patient were reviewed by me and considered in my medical decision making (see chart for details).  18 year old female presents with generalized abdominal pain, nausea and vomiting, symptoms began this morning.  Patient has history of similar  symptoms in the past that were linked to marijuana use and she does report using marijuana over the weekend, denies alcohol abuse.  Vitals are normal and patient is in no acute distress.  She has had one episode of vomiting since arriving here in the ED.  On exam she has mild generalized tenderness throughout the abdomen but no focal area of abdominal tenderness, she has had negative abdominal CTs with similar previous presentations.  Will get abdominal labs and give IV fluids, Zofran, Pepcid, Bentyl and reevaluate.  Feel this is most likely gastroenteritis versus cyclic vomiting syndrome related to marijuana use.  Labs show no leukocytosis, normal hemoglobin, no significant electrolyte derangements, normal renal and liver function and normal lipase.  Negative pregnancy.  After medications patient is feeling much better, she is requesting something to eat and drink and is feeling much better.  Patient tolerating p.o. food and fluids and requesting discharge home.  Will be discharged with Pepcid and Zofran.  Encouraged patient to discontinue marijuana use.  PCP follow-up encouraged and return precautions discussed.  Discharged home in good condition.  Final Clinical Impressions(s) / ED Diagnoses   Final diagnoses:  Non-intractable vomiting with nausea, unspecified vomiting type  Generalized abdominal pain    ED Discharge Orders         Ordered    ondansetron (ZOFRAN ODT) 4 MG disintegrating tablet     10/04/19 1449    famotidine (PEPCID) 20 MG tablet  2 times daily     10/04/19 1449    dicyclomine (BENTYL) 20 MG tablet  2 times daily     10/04/19 1449           Jacqlyn Larsen, [place] 10/04/19 1513    Virgel Manifold, MD 10/05/19 [phone]

## 2019-10-04 NOTE — Discharge Instructions (Addendum)
Use Zofran as needed for nausea and vomiting.  Take Pepcid twice daily before breakfast and dinner to help with stomach pain.  Bentyl as needed for abdominal cramping.  Please avoid marijuana.  Follow-up with your primary care doctor.  Return to the ED for new or worsening symptoms.

## 2019-10-04 NOTE — ED Notes (Signed)
Patient Alert and oriented to baseline. Stable and ambulatory to baseline. Patient verbalized understanding of the discharge instructions.  Patient belongings were taken by the patient.   

## 2019-10-04 NOTE — ED Triage Notes (Signed)
Pt here with c/o abd pain and nausea , history of same , pt states that she has been smoking weed , no etoh ,

## 2020-01-21 ENCOUNTER — Encounter (HOSPITAL_COMMUNITY): Payer: Self-pay | Admitting: Emergency Medicine

## 2020-01-21 ENCOUNTER — Emergency Department (HOSPITAL_COMMUNITY)
Admission: EM | Admit: 2020-01-21 | Discharge: 2020-01-21 | Disposition: A | Discharge status: Home / Self Care | Payer: Medicaid Other | Attending: Emergency Medicine | Admitting: Emergency Medicine

## 2020-01-21 ENCOUNTER — Other Ambulatory Visit: Payer: Self-pay

## 2020-01-21 DIAGNOSIS — Z113 Encounter for screening for infections with a predominantly sexual mode of transmission: Secondary | ICD-10-CM

## 2020-01-21 DIAGNOSIS — Z87891 Personal history of nicotine dependence: Secondary | ICD-10-CM | POA: Insufficient documentation

## 2020-01-21 DIAGNOSIS — Z79899 Other long term (current) drug therapy: Secondary | ICD-10-CM | POA: Insufficient documentation

## 2020-01-21 DIAGNOSIS — J45909 Unspecified asthma, uncomplicated: Secondary | ICD-10-CM | POA: Insufficient documentation

## 2020-01-21 DIAGNOSIS — Z91010 Allergy to peanuts: Secondary | ICD-10-CM | POA: Insufficient documentation

## 2020-01-21 DIAGNOSIS — Z202 Contact with and (suspected) exposure to infections with a predominantly sexual mode of transmission: Secondary | ICD-10-CM | POA: Insufficient documentation

## 2020-01-21 LAB — WET PREP, GENITAL
Clue Cells Wet Prep HPF POC: NONE SEEN
Sperm: NONE SEEN
Trich, Wet Prep: NONE SEEN
Yeast Wet Prep HPF POC: NONE SEEN

## 2020-01-21 LAB — HIV ANTIBODY (ROUTINE TESTING W REFLEX): HIV Screen 4th Generation wRfx: NONREACTIVE

## 2020-01-21 LAB — I-STAT BETA HCG BLOOD, ED (MC, WL, AP ONLY): I-stat hCG, quantitative: <5 m[IU]/mL (ref <5)

## 2020-01-21 LAB — URINALYSIS, ROUTINE W REFLEX MICROSCOPIC
Bilirubin Urine: NEGATIVE
Glucose, UA: NEGATIVE mg/dL
Hgb urine dipstick: NEGATIVE
Ketones, ur: NEGATIVE mg/dL
Leukocytes,Ua: NEGATIVE
Nitrite: NEGATIVE
Protein, ur: NEGATIVE mg/dL
Specific Gravity, Urine: 1.012 (ref 1.005–1.030)
pH: 6 (ref 5.0–8.0)

## 2020-01-21 LAB — RPR: RPR Ser Ql: NONREACTIVE

## 2020-01-21 NOTE — ED Triage Notes (Signed)
Pt found out her significant other is cheating on her. Would like to be checked for STD's. Denies sx

## 2020-01-21 NOTE — Discharge Instructions (Addendum)
Your labwork was reassuring today. We have tested you for gonorrhea, chlamydia, HIV, and syphilis. You will receive a call in 2-3 days IF you test positive. You can also log into mychart to check your results that way.   If you test positive you can return to the ED, health department, or your PCP's office for treatment. If positive you will need to let all partners know you have been tested and treated.

## 2020-01-21 NOTE — ED Provider Notes (Signed)
[facility] EMERGENCY DEPARTMENT Provider Note   CSN: [id number] Arrival date & time: 01/21/20  0741     History Chief Complaint  Patient presents with  . Z.11.3    Kristie Mcintosh is a 19 y.o. female who presents to the ED today requesting STI testing.  She reports she is sexually active with one female partner however they do not use protection.  She recently found out that he has been having intercourse with someone else and would like to be tested.  She is denying any symptoms at this time.  Denies fevers, chills, abdominal pain, nausea, vomiting, pelvic pain, urinary symptoms, vaginal discharge, any other associated symptoms.  Last normal menstrual period 2/15.  Patient is not currently on birth control and states there could be a chance that she is pregnant.   The history is provided by the patient.       Past Medical History:  Diagnosis Date  . Abdominal pain affecting pregnancy 05/16/2017  . Amenorrhea 05/16/2017  . Asthma   . Bacterial vaginitis 05/16/2017  . Headache(784.0)   . Obesity     Patient Active Problem List   Diagnosis Date Noted  . Enlarged tonsils 02/16/2018  . Normal labor and delivery 01/07/2018  . Bacterial vaginitis 05/16/2017  . Tension headache 08/11/2013  . Migraine without aura and without status migrainosus, not intractable 08/11/2013  . Insomnia 08/11/2013  . 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 ADD (attention deficit disorder) 08/11/2013  . Obesity (BMI 30-39.9) 08/11/2013    Past Surgical History:  Procedure Laterality Date  . ADENOIDECTOMY AND MYRINGOTOMY WITH TUBE PLACEMENT Bilateral 2001  . WISDOM TOOTH EXTRACTION Bilateral      OB History    Gravida  1   Para  1   Term  1   Preterm  0   AB  0   Living  1     SAB  0   TAB  0   Ectopic  0   Multiple  0   Live Births  1           Family History  Problem Relation Age of Onset  . Headache Mother   . Heart  disease Mother   . Migraines Maternal Grandmother   . Depression Maternal Grandmother   . Heart Problems Maternal Grandfather   . Bipolar disorder Paternal Aunt   . Autism Cousin        2 Maternal 1st Cousins have Autism  . Hyperlipidemia Other   . Diabetes Other   . Heart disease Other   . Hypertension Other     Social History   Tobacco Use  . Smoking status: Former Smoker    Packs/day: 0.25    Types: Cigarettes    Quit date: 04/25/2017    Years since quitting: 2.7  . Smokeless tobacco: Never Used  Substance Use Topics  . Alcohol use: No  . Drug use: Yes    Types: Marijuana    Home Medications Prior to Admission medications   Medication Sig Start Date End Date Taking? Authorizing Provider  beclomethasone (QVAR REDIHALER) 40 MCG/ACT inhaler Inhale 2 puffs into the lungs 2 (two) times daily. Patient not taking: Reported on 11/21/2017 08/28/17   Orvilla Cornwall A, CNM  dicyclomine (BENTYL) 20 MG tablet Take 1 tablet (20 mg total) by mouth 2 (two) times daily. 10/04/19   Dartha Lodge, PA-C  famotidine (PEPCID) 20 MG tablet Take 1 tablet (20 mg  total) by mouth 2 (two) times daily. 10/04/19   Dartha Lodge, PA-C  ibuprofen (ADVIL,MOTRIN) 600 MG tablet Take 1 tablet (600 mg total) by mouth every 6 (six) hours. Patient not taking: Reported on 02/16/2018 01/09/18   Oralia Manis, DO  ondansetron (ZOFRAN ODT) 4 MG disintegrating tablet 4mg  ODT q4 hours prn nausea/vomit 10/04/19   13/9/20, PA-C  Prenatal-Fe Fum-Methf-FA w/o A (VITAFOL-NANO) 18-0.6-0.4 MG TABS Take 1 tablet by mouth daily. Patient not taking: Reported on 08/03/2019 09/25/17   13/1/18 A, CNM  senna-docusate (SENOKOT-S) 8.6-50 MG tablet Take 2 tablets by mouth daily. Patient not taking: Reported on 08/03/2019 01/10/18   01/12/18, DO    Allergies    Eggs or egg-derived products, Peanuts [peanut oil], and Penicillin g  Review of Systems   Review of Systems  Constitutional: Negative for chills and  fever.  Gastrointestinal: Negative for abdominal pain, nausea and vomiting.  Genitourinary: Negative for difficulty urinating, dysuria, flank pain, frequency, pelvic pain and vaginal discharge.    Physical Exam Updated Vital Signs BP 116/72 (BP Location: Right Arm)   Pulse 72   Temp 98.9 F (37.2 C) (Oral)   Resp 16   Ht 4\' 11"  (1.499 m)   Wt 63.5 kg   SpO2 100%   BMI 28.28 kg/m   Physical Exam Vitals and nursing note reviewed.  Constitutional:      Appearance: She is not ill-appearing.  HENT:     Head: Normocephalic and atraumatic.  Eyes:     Conjunctiva/sclera: Conjunctivae normal.  Cardiovascular:     Rate and Rhythm: Normal rate and regular rhythm.     Pulses: Normal pulses.  Pulmonary:     Effort: Pulmonary effort is normal.     Breath sounds: Normal breath sounds. No wheezing, rhonchi or rales.  Abdominal:     Tenderness: There is no abdominal tenderness. There is no guarding or rebound.  Skin:    General: Skin is warm and dry.     Coloration: Skin is not jaundiced.  Neurological:     Mental Status: She is alert.     ED Results / Procedures / Treatments   Labs (all labs ordered are listed, but only abnormal results are displayed) Labs Reviewed  WET PREP, GENITAL - Abnormal; Notable for the following components:      Result Value   WBC, Wet Prep HPF POC FEW (*)    All other components within normal limits  URINALYSIS, ROUTINE W REFLEX MICROSCOPIC  RPR  HIV ANTIBODY (ROUTINE TESTING W REFLEX)  I-STAT BETA HCG BLOOD, ED ([facility], [facility], [facility] ONLY)  GC/CHLAMYDIA PROBE AMP ([place]) NOT AT [facility]    EKG None  Radiology No results found.  Procedures Procedures (including critical care time)  Medications Ordered in ED Medications - No data to display  ED Course  I have reviewed the triage vital signs and the nursing notes.  Pertinent labs & imaging results that were available during my care of the patient were reviewed by me and considered in my medical  decision making (see chart for details).  19 year old female here for STI testing. Completely asymptomatic at this time. Given this will have patient self swab. She is requesting HIV and RPR testing as well. She is without any focal abdominal tenderness on exam, well appearing. On arrival to the ED pt is afebrile, nontachycardic, and nontachypneic. Do not feel she needs additional labwork at this time.   Labwork reassuring.  Pt is  not pregnant today.  U/A without infection.  Wet prep with few WBCs however no clue cells, trich, and yeast. Suspect contamination.   Pt to be discharged at this time. Awaiting STI results. She is advised she can check her results through mychart but she will receive a call if any of the tests are positive. Advised to refrain from intercourse until she hears about her results. If positive she will need to return for treatment at that time however given pt is asymptomatic do not feel she needs empiric STI treatment at this time.   This note was prepared using Dragon voice recognition software and may include unintentional dictation errors due to the inherent limitations of voice recognition software.   Clinical Course as of Jan 20 931  Fri Jan 21, 2020  0819 I-stat hCG, quantitative: <5.0 [MV]    Clinical Course User Index [MV] Eustaquio Maize, PA-C   MDM Rules/Calculators/A&P                       Final Clinical Impression(s) / ED Diagnoses Final diagnoses:  Routine screening for STI (sexually transmitted infection)    Rx / DC Orders ED Discharge Orders    None       Discharge Instructions     Your labwork was reassuring today. We have tested you for gonorrhea, chlamydia, HIV, and syphilis. You will receive a call in 2-3 days IF you test positive. You can also log into mychart to check your results that way.   If you test positive you can return to the ED, health department, or your PCP's office for treatment. If positive you will need to let all  partners know you have been tested and treated.       Eustaquio Maize, PA-C 01/21/20 0932    Isla Pence, MD 01/27/20 [phone]

## 2020-01-22 LAB — GC/CHLAMYDIA PROBE AMP (~~LOC~~) NOT AT ARMC
Chlamydia: NEGATIVE
Neisseria Gonorrhea: NEGATIVE

## 2020-04-05 ENCOUNTER — Encounter (HOSPITAL_COMMUNITY): Payer: Self-pay

## 2020-04-05 ENCOUNTER — Ambulatory Visit (INDEPENDENT_AMBULATORY_CARE_PROVIDER_SITE_OTHER): Payer: Medicaid Other

## 2020-04-05 ENCOUNTER — Other Ambulatory Visit: Payer: Self-pay

## 2020-04-05 ENCOUNTER — Ambulatory Visit (HOSPITAL_COMMUNITY)
Admission: EM | Admit: 2020-04-05 | Discharge: 2020-04-05 | Disposition: A | Discharge status: Home / Self Care | Payer: Medicaid Other | Attending: Emergency Medicine | Admitting: Emergency Medicine

## 2020-04-05 DIAGNOSIS — J45909 Unspecified asthma, uncomplicated: Secondary | ICD-10-CM | POA: Diagnosis not present

## 2020-04-05 DIAGNOSIS — Z87891 Personal history of nicotine dependence: Secondary | ICD-10-CM | POA: Insufficient documentation

## 2020-04-05 DIAGNOSIS — Z6832 Body mass index (BMI) 32.0-32.9, adult: Secondary | ICD-10-CM | POA: Insufficient documentation

## 2020-04-05 DIAGNOSIS — R1110 Vomiting, unspecified: Secondary | ICD-10-CM | POA: Diagnosis not present

## 2020-04-05 DIAGNOSIS — Z791 Long term (current) use of non-steroidal anti-inflammatories (NSAID): Secondary | ICD-10-CM | POA: Insufficient documentation

## 2020-04-05 DIAGNOSIS — R0789 Other chest pain: Secondary | ICD-10-CM

## 2020-04-05 DIAGNOSIS — Z20822 Contact with and (suspected) exposure to covid-19: Secondary | ICD-10-CM | POA: Insufficient documentation

## 2020-04-05 DIAGNOSIS — R079 Chest pain, unspecified: Secondary | ICD-10-CM

## 2020-04-05 DIAGNOSIS — E669 Obesity, unspecified: Secondary | ICD-10-CM | POA: Insufficient documentation

## 2020-04-05 DIAGNOSIS — Z8709 Personal history of other diseases of the respiratory system: Secondary | ICD-10-CM

## 2020-04-05 MED ORDER — NAPROXEN 500 MG PO TABS: 500.0000 mg | ORAL_TABLET | Freq: Two times a day (BID) | ORAL | 0 refills | Status: DC

## 2020-04-05 NOTE — ED Provider Notes (Addendum)
HPI  SUBJECTIVE:  Kristie Mcintosh is a 19 y.o. female who presents with left-sided chest pain described as constant, sharp, achy for the past 3 or 4 days.  She reports occasional dry cough and body aches.  She reports vomiting starting today, but is able to tolerate p.o.  She states his chest pain radiates down her arm.  It does not go up her neck or through to her back.  States that she called EMS for this, was told that her EKG was okay.  No fevers, wheezing, shortness of breath.  No headaches, loss of sense of smell or taste, sore throat.  No nausea, diarrhea, abdominal pain.  No diaphoresis.  She states that the chest pain is getting better today, was told to come in by work for [employer] test.  No recent viral illnesses.  She has been taking aspirin with some improvement in her symptoms.  Symptoms worse with movement, exertion, deep inspiration. it Is not associated with arm movement.  She has never had symptoms like this before.  No change in physical activity, trauma to the chest.  No calf pain, swelling, surgery in the past 4 weeks, recent immobilization, hemoptysis, OCP/exogenous estrogen use.  No known Covid exposure.  No antipyretic in the past 4 to 6 hours.  She has a past medical history of asthma.  No history of smoking pneumothorax, pneumonia, DVT, PE, diabetes, hypertension, coronary disease, hypercholesterolemia, MI.  She has a past medical history of GERD in pregnancy, states that it is not bothering her.  Family history negative for early MI.  LMP: Now.  Denies the possibility of being pregnant.  PMD: None.   Past Medical History:  Diagnosis Date  . Abdominal pain affecting pregnancy 05/16/2017  . Amenorrhea 05/16/2017  . Asthma   . Bacterial vaginitis 05/16/2017  . Headache(784.0)   . Obesity     Past Surgical History:  Procedure Laterality Date  . ADENOIDECTOMY AND MYRINGOTOMY WITH TUBE PLACEMENT Bilateral 2001  . WISDOM TOOTH EXTRACTION Bilateral     Family History  Problem  Relation Age of Onset  . Headache Mother   . Heart disease Mother   . Migraines Maternal Grandmother   . Depression Maternal Grandmother   . Heart Problems Maternal Grandfather   . Bipolar disorder Paternal Aunt   . Autism Cousin        2 Maternal 1st Cousins have Autism  . Hyperlipidemia Other   . Diabetes Other   . Heart disease Other   . Hypertension Other     Social History   Tobacco Use  . Smoking status: Former Smoker    Packs/day: 0.25    Types: Cigarettes    Quit date: 04/25/2017    Years since quitting: 2.9  . Smokeless tobacco: Never Used  Substance Use Topics  . Alcohol use: No  . Drug use: Yes    Types: Marijuana    No current facility-administered medications for this encounter.  Current Outpatient Medications:  .  naproxen (NAPROSYN) 500 MG tablet, Take 1 tablet (500 mg total) by mouth 2 (two) times daily., Disp: 20 tablet, Rfl: 0  Allergies  Allergen Reactions  . Eggs Or Egg-Derived Products Anaphylaxis  . 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 Penicillin G Anaphylaxis    Has patient had a PCN reaction causing immediate rash, facial/tongue/throat swelling, SOB or lightheadedness with hypotension: yes Has patient had a PCN reaction causing severe rash involving mucus membranes or skin necrosis: yes Has  patient had a PCN reaction that required hospitalization: no Has patient had a PCN reaction occurring within the last 10 years: yes If all of the above answers are "NO", then may proceed with Cephalosporin use.     ROS  As noted in HPI.   Physical Exam  BP 119/62 (BP Location: Left Arm)   Pulse 74   Temp 98.3 F (36.8 C) (Oral)   Resp 18   Ht 4\' 11"  (1.499 m)   Wt 72.6 kg   LMP 04/01/2020   SpO2 100%   BMI 32.32 kg/m   Constitutional: Well developed, well nourished, no acute distress Eyes:  EOMI, conjunctiva normal bilaterally HENT: Normocephalic, atraumatic,mucus membranes moist Respiratory: Normal inspiratory effort.  Lungs  clear bilaterally, good air movement.  Positive reproducible left side chest wall tenderness. Cardiovascular: Normal rate regular rhythm no murmurs rubs or gallops.  Positive left-sided reproducible chest wall tenderness. GI: nondistended skin: No rash, skin intact Musculoskeletal: Calves symmetric, nontender, no edema. Neurologic: Alert & oriented x 3, no focal neuro deficits Psychiatric: Speech and behavior appropriate   ED Course   Medications - No data to display  Orders Placed This Encounter  Procedures  . SARS CORONAVIRUS 2 (TAT 6-24 HRS) Nasopharyngeal Nasopharyngeal Swab    Standing Status:   Standing    Number of Occurrences:   1    Order Specific Question:   Is this test for diagnosis or screening    Answer:   Diagnosis of ill patient    Order Specific Question:   Symptomatic for COVID-19 as defined by CDC    Answer:   Yes    Order Specific Question:   Date of Symptom Onset    Answer:   04/01/2020    Order Specific Question:   Hospitalized for COVID-19    Answer:   No    Order Specific Question:   Admitted to ICU for COVID-19    Answer:   No    Order Specific Question:   Previously tested for COVID-19    Answer:   No    Order Specific Question:   Resident in a congregate (group) care setting    Answer:   No    Order Specific Question:   Employed in healthcare setting    Answer:   No    Order Specific Question:   Pregnant    Answer:   No    Order Specific Question:   Has patient completed COVID vaccination(s) (2 doses of Pfizer/Moderna 1 dose of [employer])    Answer:   No  . DG Chest 2 View    Standing Status:   Standing    Number of Occurrences:   1    Order Specific Question:   Reason for Exam (SYMPTOM  OR DIAGNOSIS REQUIRED)    Answer:   left sided CP  . ED EKG    Standing Status:   Standing    Number of Occurrences:   1    Order Specific Question:   Reason for Exam    Answer:   Chest Pain    No results found for this or any previous visit (from  the past 24 hour(s)). DG Chest 2 View  Result Date: 04/05/2020 CLINICAL DATA:  Chest pain for 3 days, history asthma EXAM: CHEST - 2 VIEW COMPARISON:  09/25/2017 FINDINGS: Normal heart size, mediastinal contours, and pulmonary vascularity. Lungs clear. No pulmonary infiltrate, pleural effusion, or pneumothorax. Osseous structures unremarkable. IMPRESSION: Normal exam. Electronically Signed  By: Ulyses Southward M.D.   On: 04/05/2020 20:00   ED Clinical Impression  1. Left-sided chest wall pain      ED Assessment/Plan  Covid PCR sent.  EKG: Sinus bradycardia, normal axis, normal intervals.  No hypertrophy.  No ST-T wave changes.  No previous EKG for comparison.  Reviewed imaging independently. Normal CXR. See radiology report for full details.  EKG, chest x-ray reassuring.  No evidence of pericarditis, myocarditis, pneumonia, pneumothorax, dissection, pleural effusion, pulmonary edema..  She has reproducible chest wall pain.  We will treat as an musculoskeletal chest pain/chest wall pain with Naprosyn 500 mg with 1000 g Tylenol twice a day.  Work note for 2 days.  Primary care list for ongoing care.  ER return precautions given.   COVID PCR negative.  Discussed labs, imaging, MDM, treatment plan, and plan for follow-up with patient. Discussed sn/sx that should prompt return to the ED. patient agrees with plan.   Meds ordered this encounter  Medications  . naproxen (NAPROSYN) 500 MG tablet    Sig: Take 1 tablet (500 mg total) by mouth 2 (two) times daily.    Dispense:  20 tablet    Refill:  0    *This clinic note was created using Scientist, clinical (histocompatibility and immunogenetics). Therefore, there may be occasional mistakes despite careful proofreading.   ?    Domenick Gong, MD 04/06/20 Mora Bellman    Domenick Gong, MD 04/06/20 [phone]

## 2020-04-05 NOTE — Discharge Instructions (Addendum)
Naprosyn 500 mg with 1000 g Tylenol twice a day.   Below is a list of primary care practices who are taking new patients for you to follow-up with.  [facility] internal medicine clinic Ground Floor - [facility], [address] [place], [place], [place] [address] [phone]  [facility] at [facility] [address] [place], [place] [address] [phone]  [facility] [address] [place], [place] [address] [phone]  [facility] [phone] [address] [place] [address]  [facility]: [address] [place] [address]  [phone]  [facility]: [address] [place] [address]   [phone]  [facility]: [address] [place] [address]  [phone]  [facility] : [address] [place] [address] [phone]  [facility] Primary Care: [address] [place] [address] [phone]  [facility] [place] [facility]: [address] [place] [address] [phone]  Dr. Oneal Grout [address] [facility] [place] [address]  [phone]  Dr. Jackie Plum, [facility]. [address] [place], [place] [address]  [phone]  Go to www.goodrx.com to look up your medications. This will give you a list of where you can find your prescriptions at the most affordable prices. Or ask the pharmacist what the cash price is, or if they have any other discount programs available to help make your medication more affordable. This can be less expensive than what you would pay with insurance.

## 2020-04-05 NOTE — ED Triage Notes (Signed)
Patient complains of cough, chest congestion, chest pain when breathing deeply or with certain movement.

## 2020-04-06 LAB — SARS CORONAVIRUS 2 (TAT 6-24 HRS): SARS Coronavirus 2: NEGATIVE

## 2020-05-13 ENCOUNTER — Encounter (HOSPITAL_COMMUNITY): Payer: Self-pay | Admitting: Emergency Medicine

## 2020-05-13 ENCOUNTER — Other Ambulatory Visit: Payer: Self-pay

## 2020-05-13 ENCOUNTER — Inpatient Hospital Stay (HOSPITAL_COMMUNITY): Payer: Medicaid Other

## 2020-05-13 ENCOUNTER — Inpatient Hospital Stay (HOSPITAL_COMMUNITY)
Admission: AD | Admit: 2020-05-13 | Discharge: 2020-05-13 | Disposition: A | Discharge status: Home / Self Care | Payer: Medicaid Other | Attending: Obstetrics and Gynecology | Admitting: Obstetrics and Gynecology

## 2020-05-13 DIAGNOSIS — Z3201 Encounter for pregnancy test, result positive: Secondary | ICD-10-CM | POA: Insufficient documentation

## 2020-05-13 DIAGNOSIS — R109 Unspecified abdominal pain: Secondary | ICD-10-CM | POA: Diagnosis not present

## 2020-05-13 DIAGNOSIS — Z3A00 Weeks of gestation of pregnancy not specified: Secondary | ICD-10-CM | POA: Diagnosis not present

## 2020-05-13 DIAGNOSIS — B9689 Other specified bacterial agents as the cause of diseases classified elsewhere: Secondary | ICD-10-CM

## 2020-05-13 DIAGNOSIS — O26891 Other specified pregnancy related conditions, first trimester: Secondary | ICD-10-CM | POA: Insufficient documentation

## 2020-05-13 DIAGNOSIS — O219 Vomiting of pregnancy, unspecified: Secondary | ICD-10-CM | POA: Diagnosis not present

## 2020-05-13 DIAGNOSIS — O468X1 Other antepartum hemorrhage, first trimester: Secondary | ICD-10-CM

## 2020-05-13 DIAGNOSIS — O99891 Other specified diseases and conditions complicating pregnancy: Secondary | ICD-10-CM

## 2020-05-13 DIAGNOSIS — N760 Acute vaginitis: Secondary | ICD-10-CM

## 2020-05-13 DIAGNOSIS — Z3A01 Less than 8 weeks gestation of pregnancy: Secondary | ICD-10-CM

## 2020-05-13 DIAGNOSIS — Z3491 Encounter for supervision of normal pregnancy, unspecified, first trimester: Secondary | ICD-10-CM

## 2020-05-13 DIAGNOSIS — O418X10 Other specified disorders of amniotic fluid and membranes, first trimester, not applicable or unspecified: Secondary | ICD-10-CM

## 2020-05-13 LAB — CBC
HCT: 42.4 % (ref 36.0–46.0)
Hemoglobin: 13.8 g/dL (ref 12.0–15.0)
MCH: 29.4 pg (ref 26.0–34.0)
MCHC: 32.5 g/dL (ref 30.0–36.0)
MCV: 90.2 fL (ref 80.0–100.0)
Platelets: 246 10*3/uL (ref 150–400)
RBC: 4.7 MIL/uL (ref 3.87–5.11)
RDW: 12.3 % (ref 11.5–15.5)
WBC: 10.9 10*3/uL — ABNORMAL HIGH (ref 4.0–10.5)
nRBC: 0 % (ref 0.0–0.2)

## 2020-05-13 LAB — URINALYSIS, ROUTINE W REFLEX MICROSCOPIC
Bilirubin Urine: NEGATIVE
Glucose, UA: NEGATIVE mg/dL
Hgb urine dipstick: NEGATIVE
Ketones, ur: NEGATIVE mg/dL
Leukocytes,Ua: NEGATIVE
Nitrite: NEGATIVE
Protein, ur: NEGATIVE mg/dL
Specific Gravity, Urine: 1.003 — ABNORMAL LOW (ref 1.005–1.030)
pH: 7 (ref 5.0–8.0)

## 2020-05-13 LAB — WET PREP, GENITAL
Sperm: NONE SEEN
Trich, Wet Prep: NONE SEEN
Yeast Wet Prep HPF POC: NONE SEEN

## 2020-05-13 LAB — POC URINE PREG, ED: Preg Test, Ur: POSITIVE — AB

## 2020-05-13 LAB — HCG, QUANTITATIVE, PREGNANCY: hCG, Beta Chain, Quant, S: 32591 m[IU]/mL — ABNORMAL HIGH (ref <5)

## 2020-05-13 MED ORDER — METRONIDAZOLE 500 MG PO TABS
500.0000 mg | ORAL_TABLET | Freq: Two times a day (BID) | ORAL | 0 refills | Status: DC
Start: 2020-05-13 — End: 2020-06-09

## 2020-05-13 NOTE — ED Triage Notes (Signed)
Pt reports 2 + home pregnancy test.  C/o morning sickness.

## 2020-05-13 NOTE — Discharge Instructions (Signed)

## 2020-05-13 NOTE — MAU Provider Note (Signed)
History     CSN: [id number]  Arrival date and time: 05/13/20 1350   First Provider Initiated Contact with Patient 05/13/20 1654      Chief Complaint  Patient presents with  . Morning Sickness  . Abdominal Pain   HPI Kristie Mcintosh is a 19 y.o. G2P1001 at Unknown who presents from [facility] for abdominal pain. She reports abdominal pain for "the last 10 minutes." She is unable to rate the pain and says "i'm just uncomfortable." She took a pregnancy test at home that was positive. She denies any vaginal bleeding or discharge. She is repeatedly asking to eat through the history taking.   OB History    Gravida  2   Para  1   Term  1   Preterm  0   AB  0   Living  1     SAB  0   TAB  0   Ectopic  0   Multiple  0   Live Births  1           Past Medical History:  Diagnosis Date  . Abdominal pain affecting pregnancy 05/16/2017  . Amenorrhea 05/16/2017  . Asthma   . Bacterial vaginitis 05/16/2017  . Headache(784.0)   . Obesity     Past Surgical History:  Procedure Laterality Date  . ADENOIDECTOMY AND MYRINGOTOMY WITH TUBE PLACEMENT Bilateral 2001  . WISDOM TOOTH EXTRACTION Bilateral     Family History  Problem Relation Age of Onset  . Headache Mother   . Heart disease Mother   . Migraines Maternal Grandmother   . Depression Maternal Grandmother   . Heart Problems Maternal Grandfather   . Bipolar disorder Paternal Aunt   . Autism Cousin        2 Maternal 1st Cousins have Autism  . Hyperlipidemia Other   . Diabetes Other   . Heart disease Other   . Hypertension Other     Social History   Tobacco Use  . Smoking status: Former Smoker    Packs/day: 0.25    Types: Cigarettes    Quit date: 04/25/2017    Years since quitting: 3.0  . Smokeless tobacco: Never Used  Vaping Use  . Vaping Use: Never used  Substance Use Topics  . Alcohol use: No  . Drug use: Yes    Types: Marijuana    Allergies:  Allergies  Allergen Reactions  . Eggs Or Egg-Derived  Products Anaphylaxis  . 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 Penicillin G Anaphylaxis    Has patient had a PCN reaction causing immediate rash, facial/tongue/throat swelling, SOB or lightheadedness with hypotension: yes Has patient had a PCN reaction causing severe rash involving mucus membranes or skin necrosis: yes Has patient had a PCN reaction that required hospitalization: no Has patient had a PCN reaction occurring within the last 10 years: yes If all of the above answers are "NO", then may proceed with Cephalosporin use.    Medications Prior to Admission  Medication Sig Dispense Refill Last Dose  . naproxen (NAPROSYN) 500 MG tablet Take 1 tablet (500 mg total) by mouth 2 (two) times daily. 20 tablet 0     Review of Systems  Constitutional: Negative.  Negative for fatigue and fever.  HENT: Negative.   Respiratory: Negative.  Negative for shortness of breath.   Cardiovascular: Negative.  Negative for chest pain.  Gastrointestinal: Positive for abdominal pain. Negative for constipation, diarrhea, nausea and vomiting.  Genitourinary: Negative.  Negative for dysuria, vaginal bleeding and vaginal discharge.  Neurological: Negative.  Negative for dizziness and headaches.   Physical Exam   Blood pressure 113/62, pulse 64, temperature 98.6 F (37 C), temperature source Oral, resp. rate 16, height 4\' 11"  (1.499 m), weight 70.7 kg, SpO2 100 %, currently breastfeeding.  Physical Exam  Nursing note and vitals reviewed. Constitutional: She is oriented to person, place, and time. She appears well-developed. No distress.  HENT:  Head: Normocephalic.  Eyes: Pupils are equal, round, and reactive to light.  Cardiovascular: Normal rate, regular rhythm and normal heart sounds.  Respiratory: Effort normal and breath sounds normal. No respiratory distress.  GI: Soft. Bowel sounds are normal. She exhibits no distension. There is no abdominal tenderness.  Neurological: She is alert  and oriented to person, place, and time.  Skin: Skin is warm and dry.  Psychiatric: Her behavior is normal. Judgment and thought content normal.    MAU Course  Procedures Results for orders placed or performed during the hospital encounter of 05/13/20 (from the past 24 hour(s))  Urinalysis, Routine w reflex microscopic     Status: Abnormal   Collection Time: 05/13/20  2:10 PM  Result Value Ref Range   Color, Urine STRAW (A) YELLOW   APPearance CLEAR CLEAR   Specific Gravity, Urine 1.003 (L) 1.005 - 1.030   pH 7.0 5.0 - 8.0   Glucose, UA NEGATIVE NEGATIVE mg/dL   Hgb urine dipstick NEGATIVE NEGATIVE   Bilirubin Urine NEGATIVE NEGATIVE   Ketones, ur NEGATIVE NEGATIVE mg/dL   Protein, ur NEGATIVE NEGATIVE mg/dL   Nitrite NEGATIVE NEGATIVE   Leukocytes,Ua NEGATIVE NEGATIVE  POC Urine Pregnancy, ED (not at [facility])     Status: Abnormal   Collection Time: 05/13/20  3:08 PM  Result Value Ref Range   Preg Test, Ur POSITIVE (A) NEGATIVE  CBC     Status: Abnormal   Collection Time: 05/13/20  4:23 PM  Result Value Ref Range   WBC 10.9 (H) 4.0 - 10.5 K/uL   RBC 4.70 3.87 - 5.11 MIL/uL   Hemoglobin 13.8 12.0 - 15.0 g/dL   HCT 05/15/20 36 - 46 %   MCV 90.2 80.0 - 100.0 fL   MCH 29.4 26.0 - 34.0 pg   MCHC 32.5 30.0 - 36.0 g/dL   RDW 00.8 67.6 - 19.5 %   Platelets 246 150 - 400 K/uL   nRBC 0.0 0.0 - 0.2 %  Wet prep, genital     Status: Abnormal   Collection Time: 05/13/20  5:18 PM  Result Value Ref Range   Yeast Wet Prep HPF POC NONE SEEN NONE SEEN   Trich, Wet Prep NONE SEEN NONE SEEN   Clue Cells Wet Prep HPF POC PRESENT (A) NONE SEEN   WBC, Wet Prep HPF POC FEW (A) NONE SEEN   Sperm NONE SEEN    05/15/20 OB LESS THAN 14 WEEKS WITH OB TRANSVAGINAL  Result Date: 05/13/2020 CLINICAL DATA:  Abdominal pain. EXAM: OBSTETRIC <14 WK 05/15/2020 AND TRANSVAGINAL OB US TECHNIQUE: Both transabdominal and transvaginal ultrasound examinations were performed for complete evaluation of the gestation as well as the  maternal uterus, adnexal regions, and pelvic cul-de-sac. Transvaginal technique was performed to assess early pregnancy. COMPARISON:  None. FINDINGS: Intrauterine gestational sac: Single Yolk sac:  Visualized. Embryo:  Not Visualized. Cardiac Activity: Not Visualized. MSD: 14.6 mm   6 w   2 d CRL: 1.39 mm. Gestational age is too early to calculate based on crown-rump length. Subchorionic hemorrhage:  Small Maternal uterus/adnexae: Normal ovaries. Trace fluid in the cul-de-sac is likely physiologic. IMPRESSION: 1. An IUP is identified. A tiny crown-rump length is identified with no visualization of cardiac activity. Findings are suspicious but not yet definitive for failed pregnancy. Recommend follow-up US in 10-14 days for definitive diagnosis. This recommendation follows SRU consensus guidelines: Diagnostic Criteria for Nonviable Pregnancy Early in the First Trimester. [place] Med 2013; 630:1601-09. Electronically Signed   By: Dorise Bullion III M.D   On: 05/13/2020 17:55    MDM UA, UPT CBC, HCG ABO/Rh- O Pos Wet prep and gc/chlamydia US OB Comp Less 14 weeks with Transvaginal  Patient denies nausea and refused medication stating she just wants to eat.  Assessment and Plan   1. Normal intrauterine pregnancy on prenatal ultrasound in first trimester   2. Abdominal pain affecting pregnancy   3. Subchorionic hematoma in first trimester, single or unspecified fetus   4. Bacterial vaginosis    -Discharge home in stable condition -Rx for metronidazole sent to patient's pharmacy -First trimester precautions discussed -Patient advised to follow-up with [facility] ultrasound in 2 weeks to confirm viability -Patient may return to MAU as needed or if her condition were to change or worsen   [place] 05/13/2020, 4:54 PM

## 2020-05-13 NOTE — ED Provider Notes (Signed)
MSE was initiated and I personally evaluated the patient and placed orders (if any) at  3:38 PM on May 13, 2020.  The patient appears stable so that the remainder of the MSE may be completed by another provider.  Patient placed in Quick Look pathway, seen and evaluated   Chief Complaint: Abdominal pain, vomiting and positive pregnancy test  HPI:   19 year old female presenting to the ED with a chief complaint of abdominal pain and vomiting.  Believes that her last menstrual cycle was 2 months ago.  Took 2 pregnancy test that were both positive at home.  This is her second pregnancy.  Denies any abnormal vaginal bleeding.  Reports generalized abdominal pain.  Minimal improvement noted with 1 dose of Pepto-Bismol.  ROS: Abdominal pain (one)  Physical Exam:   Gen: No distress  Neuro: Awake and Alert  Skin: Warm    Focused Exam: Abdomen is generally tender without rebound or guarding.  Patient with positive pregnancy test here.  I have called over to the MAU provider who accepts the patient in transfer.  I have informed triage nurse as well.  Initiation of care has begun. The patient has been counseled on the process, plan, and necessity for staying for the completion/evaluation, and the remainder of the medical screening examination    Dietrich Pates, PA-C 05/13/20 1539    Lorre Nick, MD 05/14/20 1958

## 2020-05-13 NOTE — ED Notes (Signed)
Called report to [place] @ MAU.  Transport called to take pt to MAU.

## 2020-05-13 NOTE — MAU Note (Signed)
Kristie Mcintosh is a 19 y.o. here in MAU reporting: states for the past 2 days she has been feeling nauseated and having cramping. Emesis x 2 in the past 24 hours. No bleeding, no discharge.   LMP: unknown  Onset of complaint: 2 days  Pain score: 0/10  Vitals:   05/13/20 1352 05/13/20 1618  BP: 112/62 113/62  Pulse: 64 64  Resp: 16 16  Temp: 98.6 F (37 C) 98.6 F (37 C)  SpO2: 100% 100%     Lab orders placed from triage: UA

## 2020-05-15 LAB — GC/CHLAMYDIA PROBE AMP (~~LOC~~) NOT AT ARMC
Chlamydia: NEGATIVE
Comment: NEGATIVE
Comment: NORMAL
Neisseria Gonorrhea: NEGATIVE

## 2020-05-25 ENCOUNTER — Inpatient Hospital Stay (HOSPITAL_COMMUNITY)
Admission: AD | Admit: 2020-05-25 | Discharge: 2020-05-25 | Disposition: A | Discharge status: Other Acute Inpatient Hospital | Payer: Medicaid Other | Attending: Obstetrics and Gynecology | Admitting: Obstetrics and Gynecology

## 2020-05-25 ENCOUNTER — Other Ambulatory Visit: Payer: Self-pay

## 2020-05-25 ENCOUNTER — Ambulatory Visit
Admission: RE | Admit: 2020-05-25 | Discharge: 2020-05-25 | Disposition: A | Discharge status: Home / Self Care | Payer: Medicaid Other | Source: Ambulatory Visit

## 2020-05-25 ENCOUNTER — Ambulatory Visit: Admission: RE | Admit: 2020-05-25 | Payer: Medicaid Other | Source: Ambulatory Visit

## 2020-05-25 ENCOUNTER — Ambulatory Visit (INDEPENDENT_AMBULATORY_CARE_PROVIDER_SITE_OTHER): Payer: Self-pay | Admitting: *Deleted

## 2020-05-25 ENCOUNTER — Encounter: Payer: Self-pay | Admitting: Family Medicine

## 2020-05-25 DIAGNOSIS — O219 Vomiting of pregnancy, unspecified: Secondary | ICD-10-CM

## 2020-05-25 DIAGNOSIS — O99891 Other specified diseases and conditions complicating pregnancy: Secondary | ICD-10-CM

## 2020-05-25 DIAGNOSIS — Z712 Person consulting for explanation of examination or test findings: Secondary | ICD-10-CM

## 2020-05-25 DIAGNOSIS — Z3491 Encounter for supervision of normal pregnancy, unspecified, first trimester: Secondary | ICD-10-CM | POA: Insufficient documentation

## 2020-05-25 DIAGNOSIS — R110 Nausea: Secondary | ICD-10-CM

## 2020-05-25 DIAGNOSIS — N760 Acute vaginitis: Secondary | ICD-10-CM

## 2020-05-25 DIAGNOSIS — Z3A01 Less than 8 weeks gestation of pregnancy: Secondary | ICD-10-CM

## 2020-05-25 DIAGNOSIS — R109 Unspecified abdominal pain: Secondary | ICD-10-CM

## 2020-05-25 MED ORDER — PROMETHAZINE HCL 25 MG PO TABS
25.0000 mg | ORAL_TABLET | Freq: Four times a day (QID) | ORAL | 1 refills | Status: DC | PRN
Start: 2020-05-25 — End: 2020-06-09

## 2020-05-25 MED ORDER — METRONIDAZOLE 0.75 % VA GEL: VAGINAL | 0 refills | Status: DC

## 2020-05-25 NOTE — Progress Notes (Signed)
[place] results reviewed with Dr. Debroah Loop. Pt was informed of viable IUP @ [redacted]w[redacted]d with EDD 01/08/20. She reports that she works at [employer] and has a very stressful job. She also states she has been having severe abdominal cramping and pain "every Myla Mauriello" for the past 3 weeks. Pt denies vaginal bleeding.  She c/o having frequent nausea and vomiting. During the visit pt was clutching, squeezing and rubbing her abdomen vigorously multiple times. I questioned why she is doing this and she stated "I don't know what else to do because of the pain". I advised her not to squeeze her abdomen as it will only intensify her discomfort. Pt was encouraged to eat bland foods. Phenergan was prescribed for nausea per standing order. Prenatal care appointments will be scheduled. Pt voiced understanding of all information and instructions given.

## 2020-05-26 ENCOUNTER — Other Ambulatory Visit: Payer: Self-pay | Admitting: Obstetrics

## 2020-05-26 DIAGNOSIS — Z3480 Encounter for supervision of other normal pregnancy, unspecified trimester: Secondary | ICD-10-CM

## 2020-05-26 MED ORDER — PRENATE MINI 29-0.6-0.4-350 MG PO CAPS: 1.0000 | ORAL_CAPSULE | Freq: Every day | ORAL | 3 refills | Status: DC

## 2020-05-29 NOTE — Progress Notes (Signed)
Patient ID: MANE CONSOLO, female   DOB: 17-Feb-2001, 19 y.o.   MRN: [id number] Patient was assessed and managed by nursing staff during this encounter. I have reviewed the chart and agree with the documentation and plan. I have also made any necessary editorial changes.  Scheryl Darter, MD 05/29/2020 1:27 PM

## 2020-06-09 ENCOUNTER — Encounter (HOSPITAL_COMMUNITY): Payer: Self-pay | Admitting: Obstetrics and Gynecology

## 2020-06-09 ENCOUNTER — Inpatient Hospital Stay (HOSPITAL_COMMUNITY)
Admission: AD | Admit: 2020-06-09 | Discharge: 2020-06-09 | Disposition: A | Discharge status: Home / Self Care | Payer: Medicaid Other | Attending: Obstetrics and Gynecology | Admitting: Obstetrics and Gynecology

## 2020-06-09 DIAGNOSIS — E669 Obesity, unspecified: Secondary | ICD-10-CM | POA: Diagnosis not present

## 2020-06-09 DIAGNOSIS — O209 Hemorrhage in early pregnancy, unspecified: Secondary | ICD-10-CM | POA: Insufficient documentation

## 2020-06-09 DIAGNOSIS — R109 Unspecified abdominal pain: Secondary | ICD-10-CM | POA: Diagnosis not present

## 2020-06-09 DIAGNOSIS — O26891 Other specified pregnancy related conditions, first trimester: Secondary | ICD-10-CM | POA: Diagnosis not present

## 2020-06-09 DIAGNOSIS — O219 Vomiting of pregnancy, unspecified: Secondary | ICD-10-CM

## 2020-06-09 DIAGNOSIS — J45909 Unspecified asthma, uncomplicated: Secondary | ICD-10-CM | POA: Diagnosis not present

## 2020-06-09 DIAGNOSIS — O99511 Diseases of the respiratory system complicating pregnancy, first trimester: Secondary | ICD-10-CM | POA: Diagnosis not present

## 2020-06-09 DIAGNOSIS — Z3A09 9 weeks gestation of pregnancy: Secondary | ICD-10-CM | POA: Insufficient documentation

## 2020-06-09 DIAGNOSIS — O99211 Obesity complicating pregnancy, first trimester: Secondary | ICD-10-CM | POA: Insufficient documentation

## 2020-06-09 DIAGNOSIS — Z87891 Personal history of nicotine dependence: Secondary | ICD-10-CM | POA: Insufficient documentation

## 2020-06-09 LAB — URINALYSIS, ROUTINE W REFLEX MICROSCOPIC
Bilirubin Urine: NEGATIVE
Glucose, UA: NEGATIVE mg/dL
Hgb urine dipstick: NEGATIVE
Ketones, ur: NEGATIVE mg/dL
Leukocytes,Ua: NEGATIVE
Nitrite: NEGATIVE
Protein, ur: NEGATIVE mg/dL
Specific Gravity, Urine: >1.03 — ABNORMAL HIGH (ref 1.005–1.030)
pH: 6.5 (ref 5.0–8.0)

## 2020-06-09 MED ORDER — IBUPROFEN 800 MG PO TABS
800.0000 mg | ORAL_TABLET | Freq: Three times a day (TID) | ORAL | 0 refills | Status: DC | PRN
Start: 2020-06-09 — End: 2020-10-04

## 2020-06-09 MED ORDER — ONDANSETRON 4 MG PO TBDP
8.0000 mg | ORAL_TABLET | Freq: Once | ORAL | Status: AC
  Administered 2020-06-09: 8 mg via ORAL
  Filled 2020-06-09: qty 2

## 2020-06-09 MED ORDER — ONDANSETRON 8 MG PO TBDP
8.0000 mg | ORAL_TABLET | Freq: Three times a day (TID) | ORAL | 3 refills | Status: DC | PRN
Start: 2020-06-09 — End: 2020-10-08

## 2020-06-09 MED ORDER — IBUPROFEN 800 MG PO TABS
800.0000 mg | ORAL_TABLET | Freq: Once | ORAL | Status: AC
  Administered 2020-06-09: 800 mg via ORAL
  Filled 2020-06-09: qty 1

## 2020-06-09 NOTE — Discharge Instructions (Signed)
First Trimester of Pregnancy The first trimester of pregnancy is from week 1 until the end of week 13 (months 1 through 3). A week after a sperm fertilizes an egg, the egg will implant on the wall of the uterus. This embryo will begin to develop into a baby. Genes from you and your partner will form the baby. The female genes will determine whether the baby will be a boy or a girl. At 6-8 weeks, the eyes and face will be formed, and the heartbeat can be seen on ultrasound. At the end of 12 weeks, all the baby's organs will be formed. Now that you are pregnant, you will want to do everything you can to have a healthy baby. Two of the most important things are to get good prenatal care and to follow your health care provider's instructions. Prenatal care is all the medical care you receive before the baby's birth. This care will help prevent, find, and treat any problems during the pregnancy and childbirth. Body changes during your first trimester Your body goes through many changes during pregnancy. The changes vary from woman to woman.  You may gain or lose a couple of pounds at first.  You may feel sick to your stomach (nauseous) and you may throw up (vomit). If the vomiting is uncontrollable, call your health care provider.  You may tire easily.  You may develop headaches that can be relieved by medicines. All medicines should be approved by your health care provider.  You may urinate more often. Painful urination may mean you have a bladder infection.  You may develop heartburn as a result of your pregnancy.  You may develop constipation because certain hormones are causing the muscles that push stool through your intestines to slow down.  You may develop hemorrhoids or swollen veins (varicose veins).  Your breasts may begin to grow larger and become tender. Your nipples may stick out more, and the tissue that surrounds them (areola) may become darker.  Your gums may bleed and may be  sensitive to brushing and flossing.  Dark spots or blotches (chloasma, mask of pregnancy) may develop on your face. This will likely fade after the baby is born.  Your menstrual periods will stop.  You may have a loss of appetite.  You may develop cravings for certain kinds of food.  You may have changes in your emotions from day to day, such as being excited to be pregnant or being concerned that something may go wrong with the pregnancy and baby.  You may have more vivid and strange dreams.  You may have changes in your hair. These can include thickening of your hair, rapid growth, and changes in texture. Some women also have hair loss during or after pregnancy, or hair that feels dry or thin. Your hair will most likely return to normal after your baby is born. What to expect at prenatal visits During a routine prenatal visit:  You will be weighed to make sure you and the baby are growing normally.  Your blood pressure will be taken.  Your abdomen will be measured to track your baby's growth.  The fetal heartbeat will be listened to between weeks 10 and 14 of your pregnancy.  Test results from any previous visits will be discussed. Your health care provider may ask you:  How you are feeling.  If you are feeling the baby move.  If you have had any abnormal symptoms, such as leaking fluid, bleeding, severe headaches, or abdominal   cramping.  If you are using any tobacco products, including cigarettes, chewing tobacco, and electronic cigarettes.  If you have any questions. Other tests that may be performed during your first trimester include:  Blood tests to find your blood type and to check for the presence of any previous infections. The tests will also be used to check for low iron levels (anemia) and protein on red blood cells (Rh antibodies). Depending on your risk factors, or if you previously had diabetes during pregnancy, you may have tests to check for high blood sugar  that affects pregnant women (gestational diabetes).  Urine tests to check for infections, diabetes, or protein in the urine.  An ultrasound to confirm the proper growth and development of the baby.  Fetal screens for spinal cord problems (spina bifida) and Down syndrome.  HIV (human immunodeficiency virus) testing. Routine prenatal testing includes screening for HIV, unless you choose not to have this test.  You may need other tests to make sure you and the baby are doing well. Follow these instructions at home: Medicines  Follow your health care provider's instructions regarding medicine use. Specific medicines may be either safe or unsafe to take during pregnancy.  Take a prenatal vitamin that contains at least 600 micrograms (mcg) of folic acid.  If you develop constipation, try taking a stool softener if your health care provider approves. Eating and drinking   Eat a balanced diet that includes fresh fruits and vegetables, whole grains, good sources of protein such as meat, eggs, or tofu, and low-fat dairy. Your health care provider will help you determine the amount of weight gain that is right for you.  Avoid raw meat and uncooked cheese. These carry germs that can cause birth defects in the baby.  Eating four or five small meals rather than three large meals a day may help relieve nausea and vomiting. If you start to feel nauseous, eating a few soda crackers can be helpful. Drinking liquids between meals, instead of during meals, also seems to help ease nausea and vomiting.  Limit foods that are high in fat and processed sugars, such as fried and sweet foods.  To prevent constipation: ? Eat foods that are high in fiber, such as fresh fruits and vegetables, whole grains, and beans. ? Drink enough fluid to keep your urine clear or pale yellow. Activity  Exercise only as directed by your health care provider. Most women can continue their usual exercise routine during  pregnancy. Try to exercise for 30 minutes at least 5 days a week. Exercising will help you: ? Control your weight. ? Stay in shape. ? Be prepared for labor and delivery.  Experiencing pain or cramping in the lower abdomen or lower back is a good sign that you should stop exercising. Check with your health care provider before continuing with normal exercises.  Try to avoid standing for long periods of time. Move your legs often if you must stand in one place for a long time.  Avoid heavy lifting.  Wear low-heeled shoes and practice good posture.  You may continue to have sex unless your health care provider tells you not to. Relieving pain and discomfort  Wear a good support bra to relieve breast tenderness.  Take warm sitz baths to soothe any pain or discomfort caused by hemorrhoids. Use hemorrhoid cream if your health care provider approves.  Rest with your legs elevated if you have leg cramps or low back pain.  If you develop varicose veins in   your legs, wear support hose. Elevate your feet for 15 minutes, 3-4 times a day. Limit salt in your diet. Prenatal care  Schedule your prenatal visits by the twelfth week of pregnancy. They are usually scheduled monthly at first, then more often in the last 2 months before delivery.  Write down your questions. Take them to your prenatal visits.  Keep all your prenatal visits as told by your health care provider. This is important. Safety  Wear your seat belt at all times when driving.  Make a list of emergency phone numbers, including numbers for family, friends, the hospital, and police and fire departments. General instructions  Ask your health care provider for a referral to a local prenatal education class. Begin classes no later than the beginning of month 6 of your pregnancy.  Ask for help if you have counseling or nutritional needs during pregnancy. Your health care provider can offer advice or refer you to specialists for help  with various needs.  Do not use hot tubs, steam rooms, or saunas.  Do not douche or use tampons or scented sanitary pads.  Do not cross your legs for long periods of time.  Avoid cat litter boxes and soil used by cats. These carry germs that can cause birth defects in the baby and possibly loss of the fetus by miscarriage or stillbirth.  Avoid all smoking, herbs, alcohol, and medicines not prescribed by your health care provider. Chemicals in these products affect the formation and growth of the baby.  Do not use any products that contain nicotine or tobacco, such as cigarettes and e-cigarettes. If you need help quitting, ask your health care provider. You may receive counseling support and other resources to help you quit.  Schedule a dentist appointment. At home, brush your teeth with a soft toothbrush and be gentle when you floss. Contact a health care provider if:  You have dizziness.  You have mild pelvic cramps, pelvic pressure, or nagging pain in the abdominal area.  You have persistent nausea, vomiting, or diarrhea.  You have a bad smelling vaginal discharge.  You have pain when you urinate.  You notice increased swelling in your face, hands, legs, or ankles.  You are exposed to fifth disease or chickenpox.  You are exposed to German measles (rubella) and have never had it. Get help right away if:  You have a fever.  You are leaking fluid from your vagina.  You have spotting or bleeding from your vagina.  You have severe abdominal cramping or pain.  You have rapid weight gain or loss.  You vomit blood or material that looks like coffee grounds.  You develop a severe headache.  You have shortness of breath.  You have any kind of trauma, such as from a fall or a car accident. Summary  The first trimester of pregnancy is from week 1 until the end of week 13 (months 1 through 3).  Your body goes through many changes during pregnancy. The changes vary from  woman to woman.  You will have routine prenatal visits. During those visits, your health care provider will examine you, discuss any test results you may have, and talk with you about how you are feeling. This information is not intended to replace advice given to you by your health care provider. Make sure you discuss any questions you have with your health care provider. Document Revised: 10/24/2017 Document Reviewed: 10/23/2016 Elsevier Patient Education  2020 [employer]  

## 2020-06-09 NOTE — MAU Note (Signed)
Pt presents to MAU c/o ongoing abdominal pain and vomiting. She has also had intermittent spotting during this pregnancy. She is prescribed phenergan but cannot keep medication down.

## 2020-06-09 NOTE — MAU Provider Note (Signed)
History     CSN: [id number]  Arrival date and time: 06/09/20 1301   First Provider Initiated Contact with Patient 06/09/20 1421      Chief Complaint  Patient presents with  . Pelvic Pain   Kristie Mcintosh is a 19 y.o. G2P1001 at [redacted]w[redacted]d who presents today with spotting and pain. She was seen in the office recently and had an IUP noted on [place]. She states that the pain is so bad she has been not been able to get out of bed for almost 2 weeks.   Pelvic Pain The patient's primary symptoms include pelvic pain. The patient's pertinent negatives include no vaginal discharge. This is a new problem. The current episode started 1 to 4 weeks ago. The problem occurs constantly. The problem has been unchanged. The problem affects both sides. She is pregnant. Associated symptoms include nausea and vomiting (3-4 x daily for about 2 weeks. ). Pertinent negatives include no chills, dysuria, fever or frequency. The vaginal discharge was bloody. The vaginal bleeding is spotting. She has not been passing clots. She has not been passing tissue. The symptoms are aggravated by activity. She has tried acetaminophen for the symptoms. The treatment provided mild relief.    OB History    Gravida  2   Para  1   Term  1   Preterm  0   AB  0   Living  1     SAB  0   TAB  0   Ectopic  0   Multiple  0   Live Births  1           Past Medical History:  Diagnosis Date  . Abdominal pain affecting pregnancy 05/16/2017  . Amenorrhea 05/16/2017  . Asthma   . Bacterial vaginitis 05/16/2017  . Headache(784.0)   . Obesity     Past Surgical History:  Procedure Laterality Date  . ADENOIDECTOMY AND MYRINGOTOMY WITH TUBE PLACEMENT Bilateral 2001  . WISDOM TOOTH EXTRACTION Bilateral     Family History  Problem Relation Age of Onset  . Headache Mother   . Heart disease Mother   . Migraines Maternal Grandmother   . Depression Maternal Grandmother   . Heart Problems Maternal Grandfather   . Bipolar  disorder Paternal Aunt   . Autism Cousin        2 Maternal 1st Cousins have Autism  . Hyperlipidemia Other   . Diabetes Other   . Heart disease Other   . Hypertension Other     Social History   Tobacco Use  . Smoking status: Former Smoker    Packs/day: 0.25    Types: Cigarettes    Quit date: 04/25/2017    Years since quitting: 3.1  . Smokeless tobacco: Never Used  Vaping Use  . Vaping Use: Never used  Substance Use Topics  . Alcohol use: No  . Drug use: Not Currently    Allergies:  Allergies  Allergen Reactions  . Eggs Or Egg-Derived Products Anaphylaxis  . 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 Penicillin G Anaphylaxis    Has patient had a PCN reaction causing immediate rash, facial/tongue/throat swelling, SOB or lightheadedness with hypotension: yes Has patient had a PCN reaction causing severe rash involving mucus membranes or skin necrosis: yes Has patient had a PCN reaction that required hospitalization: no Has patient had a PCN reaction occurring within the last 10 years: yes If all of the above answers are "NO", then may proceed  with Cephalosporin use.  . Promethazine Hives and Nausea And Vomiting    Medications Prior to Admission  Medication Sig Dispense Refill Last Dose  . Prenat w/o A-FeCbn-Meth-FA-DHA (PRENATE MINI) 29-0.6-0.4-350 MG CAPS Take 1 capsule by mouth daily before breakfast. 90 capsule 3 06/09/2020 at Unknown time  . metroNIDAZOLE (FLAGYL) 500 MG tablet Take 1 tablet (500 mg total) by mouth 2 (two) times daily. (Patient not taking: Reported on 05/25/2020) 14 tablet 0   . metroNIDAZOLE (METROGEL VAGINAL) 0.75 % vaginal gel Place one applicatorful in vagina at bedtime for 5 nights 70 g 0     Review of Systems  Constitutional: Negative for chills and fever.  Gastrointestinal: Positive for nausea and vomiting (3-4 x daily for about 2 weeks. ).  Genitourinary: Positive for pelvic pain and vaginal bleeding. Negative for decreased urine volume,  dysuria, frequency and vaginal discharge.   Physical Exam   Blood pressure 139/84, pulse 71, temperature 98.5 F (36.9 C), temperature source Oral, resp. rate 18, height 4\' 11"  (1.499 m), weight 69.9 kg, SpO2 100 %, currently breastfeeding.  Physical Exam Vitals and nursing note reviewed.  HENT:     Head: Normocephalic.  Cardiovascular:     Rate and Rhythm: Normal rate.  Pulmonary:     Effort: Pulmonary effort is normal.  Abdominal:     Palpations: Abdomen is soft.     Tenderness: There is no abdominal tenderness.  Neurological:     Mental Status: She is alert and oriented to person, place, and time.  Psychiatric:        Mood and Affect: Mood normal.        Behavior: Behavior normal.    Pt informed that the ultrasound is considered a limited OB ultrasound and is not intended to be a complete ultrasound exam.  Patient also informed that the ultrasound is not being completed with the intent of assessing for fetal or placental anomalies or any pelvic abnormalities.  Explained that the purpose of today's ultrasound is to assess for  viability.  Patient acknowledges the purpose of the exam and the limitations of the study.    +FHT 160 CRL = 9 weeks 4 days    Results for orders placed or performed during the hospital encounter of 06/09/20 (from the past 24 hour(s))  Urinalysis, Routine w reflex microscopic     Status: Abnormal   Collection Time: 06/09/20  1:28 PM  Result Value Ref Range   Color, Urine YELLOW YELLOW   APPearance HAZY (A) CLEAR   Specific Gravity, Urine >1.030 (H) 1.005 - 1.030   pH 6.5 5.0 - 8.0   Glucose, UA NEGATIVE NEGATIVE mg/dL   Hgb urine dipstick NEGATIVE NEGATIVE   Bilirubin Urine NEGATIVE NEGATIVE   Ketones, ur NEGATIVE NEGATIVE mg/dL   Protein, ur NEGATIVE NEGATIVE mg/dL   Nitrite NEGATIVE NEGATIVE   Leukocytes,Ua NEGATIVE NEGATIVE    MAU Course  Procedures  MDM Patient given zofran for nausea and ibuprofen for pain/cramping.  Patient reports  that her pain has improved. Zofran has helped with nausea, and she is tolerating PO    Assessment and Plan   1. Nausea/vomiting in pregnancy   2. Abdominal pain in pregnancy, first trimester   3. [redacted] weeks gestation of pregnancy    DC home Comfort measures reviewed  1st Trimester precautions  Bleeding precautions RX: zofran 8mg  ODT PRN #20 with 3RF. Ibuprofen 800mg  PRN. Patient advised to use very sparingly #15 tabs with 0RF given  Return to MAU as needed  FU with OB as planned   Follow-up Information    [facility] Follow up.   Contact information: [address] [place] [address] [phone]               Thressa Sheller DNP, CNM  06/09/20  3:08 PM

## 2020-06-15 ENCOUNTER — Ambulatory Visit (INDEPENDENT_AMBULATORY_CARE_PROVIDER_SITE_OTHER): Payer: Medicaid Other

## 2020-06-15 DIAGNOSIS — Z3481 Encounter for supervision of other normal pregnancy, first trimester: Secondary | ICD-10-CM

## 2020-06-15 DIAGNOSIS — Z3480 Encounter for supervision of other normal pregnancy, unspecified trimester: Secondary | ICD-10-CM

## 2020-06-15 DIAGNOSIS — Z3490 Encounter for supervision of normal pregnancy, unspecified, unspecified trimester: Secondary | ICD-10-CM | POA: Insufficient documentation

## 2020-06-15 DIAGNOSIS — Z3A10 10 weeks gestation of pregnancy: Secondary | ICD-10-CM

## 2020-06-15 MED ORDER — GOJJI WEIGHT SCALE MISC: 1.0000 | 0 refills | Status: DC | PRN

## 2020-06-15 MED ORDER — BLOOD PRESSURE KIT DEVI: 1.0000 | 0 refills | Status: DC | PRN

## 2020-06-15 NOTE — Patient Instructions (Signed)
First Trimester of Pregnancy  The first trimester of pregnancy is from week 1 until the end of week 13 (months 1 through 3). During this time, your baby will begin to develop inside you. At 6-8 weeks, the eyes and face are formed, and the heartbeat can be seen on ultrasound. At the end of 12 weeks, all the baby's organs are formed. Prenatal care is all the medical care you receive before the birth of your baby. Make sure you get good prenatal care and follow all of your doctor's instructions. Follow these instructions at home: Medicines  Take over-the-counter and prescription medicines only as told by your doctor. Some medicines are safe and some medicines are not safe during pregnancy.  Take a prenatal vitamin that contains at least 600 micrograms (mcg) of folic acid.  If you have trouble pooping (constipation), take medicine that will make your stool soft (stool softener) if your doctor approves. Eating and drinking   Eat regular, healthy meals.  Your doctor will tell you the amount of weight gain that is right for you.  Avoid raw meat and uncooked cheese.  If you feel sick to your stomach (nauseous) or throw up (vomit): ? Eat 4 or 5 small meals a day instead of 3 large meals. ? Try eating a few soda crackers. ? Drink liquids between meals instead of during meals.  To prevent constipation: ? Eat foods that are high in fiber, like fresh fruits and vegetables, whole grains, and beans. ? Drink enough fluids to keep your pee (urine) clear or pale yellow. Activity  Exercise only as told by your doctor. Stop exercising if you have cramps or pain in your lower belly (abdomen) or low back.  Do not exercise if it is too hot, too humid, or if you are in a place of great height (high altitude).  Try to avoid standing for long periods of time. Move your legs often if you must stand in one place for a long time.  Avoid heavy lifting.  Wear low-heeled shoes. Sit and stand up  straight.  You can have sex unless your doctor tells you not to. Relieving pain and discomfort  Wear a good support bra if your breasts are sore.  Take warm water baths (sitz baths) to soothe pain or discomfort caused by hemorrhoids. Use hemorrhoid cream if your doctor says it is okay.  Rest with your legs raised if you have leg cramps or low back pain.  If you have puffy, bulging veins (varicose veins) in your legs: ? Wear support hose or compression stockings as told by your doctor. ? Raise (elevate) your feet for 15 minutes, 3-4 times a day. ? Limit salt in your food. Prenatal care  Schedule your prenatal visits by the twelfth week of pregnancy.  Write down your questions. Take them to your prenatal visits.  Keep all your prenatal visits as told by your doctor. This is important. Safety  Wear your seat belt at all times when driving.  Make a list of emergency phone numbers. The list should include numbers for family, friends, the hospital, and police and fire departments. General instructions  Ask your doctor for a referral to a local prenatal class. Begin classes no later than at the start of month 6 of your pregnancy.  Ask for help if you need counseling or if you need help with nutrition. Your doctor can give you advice or tell you where to go for help.  Do not use hot tubs, steam   rooms, or saunas.  Do not douche or use tampons or scented sanitary pads.  Do not cross your legs for long periods of time.  Avoid all herbs and alcohol. Avoid drugs that are not approved by your doctor.  Do not use any tobacco products, including cigarettes, chewing tobacco, and electronic cigarettes. If you need help quitting, ask your doctor. You may get counseling or other support to help you quit.  Avoid cat litter boxes and soil used by cats. These carry germs that can cause birth defects in the baby and can cause a loss of your baby (miscarriage) or stillbirth.  Visit your dentist.  At home, brush your teeth with a soft toothbrush. Be gentle when you floss. Contact a doctor if:  You are dizzy.  You have mild cramps or pressure in your lower belly.  You have a nagging pain in your belly area.  You continue to feel sick to your stomach, you throw up, or you have watery poop (diarrhea).  You have a bad smelling fluid coming from your vagina.  You have pain when you pee (urinate).  You have increased puffiness (swelling) in your face, hands, legs, or ankles. Get help right away if:  You have a fever.  You are leaking fluid from your vagina.  You have spotting or bleeding from your vagina.  You have very bad belly cramping or pain.  You gain or lose weight rapidly.  You throw up blood. It may look like coffee grounds.  You are around people who have German measles, fifth disease, or chickenpox.  You have a very bad headache.  You have shortness of breath.  You have any kind of trauma, such as from a fall or a car accident. Summary  The first trimester of pregnancy is from week 1 until the end of week 13 (months 1 through 3).  To take care of yourself and your unborn baby, you will need to eat healthy meals, take medicines only if your doctor tells you to do so, and do activities that are safe for you and your baby.  Keep all follow-up visits as told by your doctor. This is important as your doctor will have to ensure that your baby is healthy and growing well. This information is not intended to replace advice given to you by your health care provider. Make sure you discuss any questions you have with your health care provider. Document Revised: 03/04/2019 Document Reviewed: 11/19/2016 Elsevier Patient Education  2020 [employer]  

## 2020-06-15 NOTE — Progress Notes (Signed)
.    Virtual Visit via Telephone Note  I connected with Kristie Mcintosh on 06/15/20 at  9:00 AM EDT by telephone and verified that I am speaking with the correct person using two identifiers.  Location: Patient: Kristie Mcintosh Provider: Candice Camp discussed the limitations, risks, security and privacy concerns of performing an evaluation and management service by telephone and the availability of in person appointments. I also discussed with the patient that there may be a patient responsible charge related to this service. The patient expressed understanding and agreed to proceed.   History of Present Illness: PRENATAL INTAKE SUMMARY  Ms. Douthitt presents today New OB Nurse Interview.  OB History    Gravida  2   Para  1   Term  1   Preterm  0   AB  0   Living  1     SAB  0   TAB  0   Ectopic  0   Multiple  0   Live Births  1          I have reviewed the patient's medical, obstetrical, social, and family histories, medications, and available lab results.  SUBJECTIVE No unusual complaints    Observations/Objective: Initial nurse interview for history/labs (New OB)  EDD: 01/07/2021 GA: [redacted]w[redacted]d G2P1 FHT: non face to face interview   GENERAL APPEARANCE: oriented to person, place and time, non face to face interview   Assessment and Plan: Normal pregnancy Prenatal care - [facility]  Labs/pap to be completed at [facility] provider visit  Download Babyscripts  BP cuff and scale sent to [facility]    Follow Up Instructions:   I discussed the assessment and treatment plan with the patient. The patient was provided an opportunity to ask questions and all were answered. The patient agreed with the plan and demonstrated an understanding of the instructions.   The patient was advised to call back or seek an in-person evaluation if the symptoms worsen or if the condition fails to improve as anticipated.  I provided 15 minutes of non-face-to-face time during this  encounter.   Dalphine Handing, CMA

## 2020-06-15 NOTE — Progress Notes (Signed)
Patient was assessed and managed by nursing staff during this encounter. I have reviewed the chart and agree with the documentation and plan. I have also made any necessary editorial changes.  Marylen Ponto, NP 06/15/2020 10:42 AM

## 2020-06-22 ENCOUNTER — Other Ambulatory Visit: Payer: Self-pay

## 2020-06-22 ENCOUNTER — Encounter: Payer: Self-pay | Admitting: Obstetrics and Gynecology

## 2020-06-22 ENCOUNTER — Ambulatory Visit (INDEPENDENT_AMBULATORY_CARE_PROVIDER_SITE_OTHER): Payer: Medicaid Other | Admitting: Obstetrics and Gynecology

## 2020-06-22 ENCOUNTER — Other Ambulatory Visit (HOSPITAL_COMMUNITY)
Admission: RE | Admit: 2020-06-22 | Discharge: 2020-06-22 | Disposition: A | Discharge status: Home / Self Care | Payer: Medicaid Other | Source: Ambulatory Visit | Attending: Obstetrics and Gynecology | Admitting: Obstetrics and Gynecology

## 2020-06-22 VITALS — BP 113/69 | HR 62 | Wt 153.0 lb

## 2020-06-22 DIAGNOSIS — Z3480 Encounter for supervision of other normal pregnancy, unspecified trimester: Secondary | ICD-10-CM | POA: Diagnosis present

## 2020-06-22 DIAGNOSIS — Z3481 Encounter for supervision of other normal pregnancy, first trimester: Secondary | ICD-10-CM

## 2020-06-22 DIAGNOSIS — O219 Vomiting of pregnancy, unspecified: Secondary | ICD-10-CM | POA: Insufficient documentation

## 2020-06-22 DIAGNOSIS — Z3A11 11 weeks gestation of pregnancy: Secondary | ICD-10-CM | POA: Diagnosis not present

## 2020-06-22 MED ORDER — DOXYLAMINE-PYRIDOXINE 10-10 MG PO TBEC: 2.0000 | DELAYED_RELEASE_TABLET | Freq: Every day | ORAL | 5 refills | Status: DC

## 2020-06-22 MED ORDER — METOCLOPRAMIDE HCL 10 MG PO TABS: 10.0000 mg | ORAL_TABLET | Freq: Four times a day (QID) | ORAL | 2 refills | Status: DC | PRN

## 2020-06-22 NOTE — Progress Notes (Signed)
INITIAL PRENATAL VISIT NOTE  Subjective:  Kristie Mcintosh is a 19 y.o. G2P1001 at 14w4dby ultrasound on 7/1 being seen today for her initial prenatal visit. This is a unplanned pregnancy. She was using nothing for birth control previously. She has an obstetric history significant for nausea and vomiting. She has a medical history significant for asthma.  Patient reports extreme nausea and vomiting.  Contractions: Not present. Vag. Bleeding: None.  Movement: Absent. Denies leaking of fluid.    Past Medical History:  Diagnosis Date  . Abdominal pain affecting pregnancy 05/16/2017  . Amenorrhea 05/16/2017  . Asthma   . Bacterial vaginitis 05/16/2017  . Headache(784.0)   . Obesity     Past Surgical History:  Procedure Laterality Date  . ADENOIDECTOMY AND MYRINGOTOMY WITH TUBE PLACEMENT Bilateral 2001  . WISDOM TOOTH EXTRACTION Bilateral     OB History  Gravida Para Term Preterm AB Living  '2 1 1 '$ 0 0 1  SAB TAB Ectopic Multiple Live Births  0 0 0 0 1    # Outcome Date GA Lbr Len/2nd Weight Sex Delivery Anes PTL Lv  2 Current           1 Term 01/07/18 [id number] / 00:11 5 lb 12.4 oz (2.62 kg) M Vag-Spont EPI  LIV    Social History   Socioeconomic History  . Marital status: Significant Other    Spouse name: Not on file  . Number of children: Not on file  . Years of education: Not on file  . Highest education level: Not on file  Occupational History  . Not on file  Tobacco Use  . Smoking status: Former Smoker    Packs/day: 0.25    Types: Cigarettes    Quit date: 04/25/2017    Years since quitting: 3.1  . Smokeless tobacco: Never Used  Vaping Use  . Vaping Use: Never used  Substance and Sexual Activity  . Alcohol use: No  . Drug use: Not Currently  . Sexual activity: Yes    Birth control/protection: None  Other Topics Concern  . Not on file  Social History Narrative  . Not on file   Social Determinants of Health   Financial Resource Strain:   . Difficulty of  Paying Living Expenses:   Food Insecurity:   . Worried About RuCharity fundraisern the Last Year:   . RaArboriculturistn the Last Year:   Transportation Needs:   . LaFilm/video editorMedical):   . Marland Kitchenack of Transportation (Non-Medical):   Physical Activity:   . Days of Exercise per Week:   . Minutes of Exercise per Session:   Stress:   . Feeling of Stress :   Social Connections:   . Frequency of Communication with Friends and Family:   . Frequency of Social Gatherings with Friends and Family:   . Attends Religious Services:   . Active Member of Clubs or Organizations:   . Attends ClArchivisteetings:   . Marland Kitchenarital Status:     Family History  Problem Relation Age of Onset  . Headache Mother   . Heart disease Mother   . Migraines Maternal Grandmother   . Depression Maternal Grandmother   . Heart Problems Maternal Grandfather   . Bipolar disorder Paternal Aunt   . Autism Cousin        2 Maternal 1st Cousins have Autism  . Hyperlipidemia Other   . Diabetes Other   . Heart disease  Other   . Hypertension Other      Current Outpatient Medications:  .  ibuprofen (ADVIL) 800 MG tablet, Take 1 tablet (800 mg total) by mouth every 8 (eight) hours as needed for cramping., Disp: 15 tablet, Rfl: 0 .  ondansetron (ZOFRAN ODT) 8 MG disintegrating tablet, Take 1 tablet (8 mg total) by mouth every 8 (eight) hours as needed for nausea or vomiting., Disp: 20 tablet, Rfl: 3 .  Prenat w/o A-FeCbn-Meth-FA-DHA (PRENATE MINI) 29-0.6-0.4-350 MG CAPS, Take 1 capsule by mouth daily before breakfast., Disp: 90 capsule, Rfl: 3 .  Prenatal Vit-Fe Fumarate-FA (WESTAB PLUS) 27-1 MG TABS, TAKE 1 CAPSULE BY MOUTH DAILY BEFORE BREAKFAST, Disp: , Rfl:  .  Blood Pressure Monitoring (BLOOD PRESSURE KIT) DEVI, 1 Device by Does not apply route as needed., Disp: 1 each, Rfl: 0 .  Doxylamine-Pyridoxine (DICLEGIS) 10-10 MG TBEC, Take 2 tablets by mouth at bedtime. If symptoms persist, add one tablet in  the morning and one in the afternoon, Disp: 100 tablet, Rfl: 5 .  metoCLOPramide (REGLAN) 10 MG tablet, Take 1 tablet (10 mg total) by mouth 4 (four) times daily as needed for nausea or vomiting., Disp: 30 tablet, Rfl: 2 .  Misc. Devices (GOJJI WEIGHT SCALE) MISC, 1 Device by Does not apply route as needed., Disp: 1 each, Rfl: 0  Allergies  Allergen Reactions  . Eggs Or Egg-Derived Products Anaphylaxis  . 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 Penicillin G Anaphylaxis    Has patient had a PCN reaction causing immediate rash, facial/tongue/throat swelling, SOB or lightheadedness with hypotension: yes Has patient had a PCN reaction causing severe rash involving mucus membranes or skin necrosis: yes Has patient had a PCN reaction that required hospitalization: no Has patient had a PCN reaction occurring within the last 10 years: yes If all of the above answers are "NO", then may proceed with Cephalosporin use.  . Promethazine Hives and Nausea And Vomiting    Review of Systems: Negative except for what is mentioned in HPI.  Objective:   Vitals:   06/22/20 1016  BP: 113/69  Pulse: 62  Weight: 153 lb (69.4 kg)    Fetal Status:   Fundal Height: 163 cm Movement: Absent     Physical Exam: BP 113/69   Pulse 62   Wt 153 lb (69.4 kg)   LMP  (LMP Unknown)   BMI 30.90 kg/m  CONSTITUTIONAL: Well-developed, well-nourished female in mild distress from nausea.  NEUROLOGIC: Alert and oriented to person, place, and time. Normal reflexes, muscle tone coordination. No cranial nerve deficit noted. PSYCHIATRIC: Normal mood and affect. Normal behavior. Normal judgment and thought content. SKIN: Skin is warm and dry. No rash noted. Not diaphoretic. No erythema. No pallor. HENT:  Normocephalic, atraumatic, External right and left ear normal. Oropharynx is clear and moist EYES: Conjunctivae and EOM are normal. Pupils are equal, round, and reactive to light. No scleral icterus.  NECK: Normal  range of motion, supple, no masses CARDIOVASCULAR: Normal heart rate noted, regular rhythm RESPIRATORY: Effort and breath sounds normal, no problems with respiration noted BREASTS: symmetric, non-tender, no masses palpable ABDOMEN: Soft, nontender, nondistended, gravid. GU: normal appearing external female genitalia, multiparous normal appearing cervix, scant white discharge in vagina, no lesions noted, pap taken Bimanual: 10 weeks sized uterus, no adnexal tenderness or palpable lesions noted MUSCULOSKELETAL: Normal range of motion. EXT:  No edema and no tenderness. 2+ distal pulses.   Assessment and Plan:  Pregnancy: G2P1001 at  30w4dby ultrasound  1. Supervision of other normal pregnancy, antepartum  OB panel with Hep C drawn Pap taken Genetic testing Significant nausea and vomiting noted, concerning for hyperemesis gravidarum rx for diclegis and reglan, pt already had rx for zofran Will check TSH and BMP due to excessive vomiting   Preterm labor symptoms and general obstetric precautions including but not limited to vaginal bleeding, contractions, leaking of fluid and fetal movement were reviewed in detail with the patient.  Please refer to After Visit Summary for other counseling recommendations.   Return in about 4 weeks (around 07/20/2020) for ROB, in person.  LGriffin Basil7/29/2021 12:53 PM

## 2020-06-22 NOTE — Progress Notes (Signed)
Pt. Will need pap smear Complaining of nausea and vomiting

## 2020-06-22 NOTE — Patient Instructions (Signed)
First Trimester of Pregnancy  The first trimester of pregnancy is from week 1 until the end of week 13 (months 1 through 3). During this time, your baby will begin to develop inside you. At 6-8 weeks, the eyes and face are formed, and the heartbeat can be seen on ultrasound. At the end of 12 weeks, all the baby's organs are formed. Prenatal care is all the medical care you receive before the birth of your baby. Make sure you get good prenatal care and follow all of your doctor's instructions. Follow these instructions at home: Medicines  Take over-the-counter and prescription medicines only as told by your doctor. Some medicines are safe and some medicines are not safe during pregnancy.  Take a prenatal vitamin that contains at least 600 micrograms (mcg) of folic acid.  If you have trouble pooping (constipation), take medicine that will make your stool soft (stool softener) if your doctor approves. Eating and drinking   Eat regular, healthy meals.  Your doctor will tell you the amount of weight gain that is right for you.  Avoid raw meat and uncooked cheese.  If you feel sick to your stomach (nauseous) or throw up (vomit): ? Eat 4 or 5 small meals a day instead of 3 large meals. ? Try eating a few soda crackers. ? Drink liquids between meals instead of during meals.  To prevent constipation: ? Eat foods that are high in fiber, like fresh fruits and vegetables, whole grains, and beans. ? Drink enough fluids to keep your pee (urine) clear or pale yellow. Activity  Exercise only as told by your doctor. Stop exercising if you have cramps or pain in your lower belly (abdomen) or low back.  Do not exercise if it is too hot, too humid, or if you are in a place of great height (high altitude).  Try to avoid standing for long periods of time. Move your legs often if you must stand in one place for a long time.  Avoid heavy lifting.  Wear low-heeled shoes. Sit and stand up  straight.  You can have sex unless your doctor tells you not to. Relieving pain and discomfort  Wear a good support bra if your breasts are sore.  Take warm water baths (sitz baths) to soothe pain or discomfort caused by hemorrhoids. Use hemorrhoid cream if your doctor says it is okay.  Rest with your legs raised if you have leg cramps or low back pain.  If you have puffy, bulging veins (varicose veins) in your legs: ? Wear support hose or compression stockings as told by your doctor. ? Raise (elevate) your feet for 15 minutes, 3-4 times a day. ? Limit salt in your food. Prenatal care  Schedule your prenatal visits by the twelfth week of pregnancy.  Write down your questions. Take them to your prenatal visits.  Keep all your prenatal visits as told by your doctor. This is important. Safety  Wear your seat belt at all times when driving.  Make a list of emergency phone numbers. The list should include numbers for family, friends, the hospital, and police and fire departments. General instructions  Ask your doctor for a referral to a local prenatal class. Begin classes no later than at the start of month 6 of your pregnancy.  Ask for help if you need counseling or if you need help with nutrition. Your doctor can give you advice or tell you where to go for help.  Do not use hot tubs, steam   rooms, or saunas.  Do not douche or use tampons or scented sanitary pads.  Do not cross your legs for long periods of time.  Avoid all herbs and alcohol. Avoid drugs that are not approved by your doctor.  Do not use any tobacco products, including cigarettes, chewing tobacco, and electronic cigarettes. If you need help quitting, ask your doctor. You may get counseling or other support to help you quit.  Avoid cat litter boxes and soil used by cats. These carry germs that can cause birth defects in the baby and can cause a loss of your baby (miscarriage) or stillbirth.  Visit your dentist.  At home, brush your teeth with a soft toothbrush. Be gentle when you floss. Contact a doctor if:  You are dizzy.  You have mild cramps or pressure in your lower belly.  You have a nagging pain in your belly area.  You continue to feel sick to your stomach, you throw up, or you have watery poop (diarrhea).  You have a bad smelling fluid coming from your vagina.  You have pain when you pee (urinate).  You have increased puffiness (swelling) in your face, hands, legs, or ankles. Get help right away if:  You have a fever.  You are leaking fluid from your vagina.  You have spotting or bleeding from your vagina.  You have very bad belly cramping or pain.  You gain or lose weight rapidly.  You throw up blood. It may look like coffee grounds.  You are around people who have [place] measles, fifth disease, or chickenpox.  You have a very bad headache.  You have shortness of breath.  You have any kind of trauma, such as from a fall or a car accident. Summary  The first trimester of pregnancy is from week 1 until the end of week 13 (months 1 through 3).  To take care of yourself and your unborn baby, you will need to eat healthy meals, take medicines only if your doctor tells you to do so, and do activities that are safe for you and your baby.  Keep all follow-up visits as told by your doctor. This is important as your doctor will have to ensure that your baby is healthy and growing well. This information is not intended to replace advice given to you by your health care provider. Make sure you discuss any questions you have with your health care provider. Document Revised: 03/04/2019 Document Reviewed: 11/19/2016 Elsevier Patient Education  2020 [employer] Hyperemesis Gravidarum Hyperemesis gravidarum is a severe form of nausea and vomiting that happens during pregnancy. Hyperemesis is worse than morning sickness. It may cause you to have nausea or vomiting all day for many  days. It may keep you from eating and drinking enough food and liquids, which can lead to dehydration, malnutrition, and weight loss. Hyperemesis usually occurs during the first half (the first 20 weeks) of pregnancy. It often goes away once a woman is in her second half of pregnancy. However, sometimes hyperemesis continues through an entire pregnancy. What are the causes? The cause of this condition is not known. It may be related to changes in chemicals (hormones) in the body during pregnancy, such as the high level of pregnancy hormone (human chorionic gonadotropin) or the increase in the female sex hormone (estrogen). What are the signs or symptoms? Symptoms of this condition include:  Nausea that does not go away.  Vomiting that does not allow you to keep any food down.  Weight  loss.  Body fluid loss (dehydration).  Having no desire to eat, or not liking food that you have previously enjoyed. How is this diagnosed? This condition may be diagnosed based on:  A physical exam.  Your medical history.  Your symptoms.  Blood tests.  Urine tests. How is this treated? This condition is managed by controlling symptoms. This may include:  Following an eating plan. This can help lessen nausea and vomiting.  Taking prescription medicines. An eating plan and medicines are often used together to help control symptoms. If medicines do not help relieve nausea and vomiting, you may need to receive fluids through an IV at the hospital. Follow these instructions at home: Eating and drinking   Avoid the following: ? Drinking fluids with meals. Try not to drink anything during the 30 minutes before and after your meals. ? Drinking more than 1 cup of fluid at a time. ? Eating foods that trigger your symptoms. These may include spicy foods, coffee, high-fat foods, very sweet foods, and acidic foods. ? Skipping meals. Nausea can be more intense on an empty stomach. If you cannot tolerate  food, do not force it. Try sucking on ice chips or other frozen items and make up for missed calories later. ? Lying down within 2 hours after eating. ? Being exposed to environmental triggers. These may include food smells, smoky rooms, closed spaces, rooms with strong smells, warm or humid places, overly loud and noisy rooms, and rooms with motion or flickering lights. Try eating meals in a well-ventilated area that is free of strong smells. ? Quick and sudden changes in your movement. ? Taking iron pills and multivitamins that contain iron. If you take prescription iron pills, do not stop taking them unless your health care provider approves. ? Preparing food. The smell of food can spoil your appetite or trigger nausea.  To help relieve your symptoms: ? Listen to your body. Everyone is different and has different preferences. Find what works best for you. ? Eat and drink slowly. ? Eat 5-6 small meals daily instead of 3 large meals. Eating small meals and snacks can help you avoid an empty stomach. ? In the morning, before getting out of bed, eat a couple of crackers to avoid moving around on an empty stomach. ? Try eating starchy foods as these are usually tolerated well. Examples include cereal, toast, bread, potatoes, pasta, rice, and pretzels. ? Include at least 1 serving of protein with your meals and snacks. Protein options include lean meats, poultry, seafood, beans, nuts, nut butters, eggs, cheese, and yogurt. ? Try eating a protein-rich snack before bed. Examples of a protein-rick snack include cheese and crackers or a peanut butter sandwich made with 1 slice of whole-wheat bread and 1 tsp (5 g) of peanut butter. ? Eat or suck on things that have ginger in them. It may help relieve nausea. Add  tsp ground ginger to hot tea or choose ginger tea. ? Try drinking 100% fruit juice or an electrolyte drink. An electrolyte drink contains sodium, potassium, and chloride. ? Drink fluids that are  cold, clear, and carbonated or sour. Examples include lemonade, ginger ale, lemon-lime soda, ice water, and sparkling water. ? Brush your teeth or use a mouth rinse after meals. ? Talk with your health care provider about starting a supplement of vitamin B6. General instructions  Take over-the-counter and prescription medicines only as told by your health care provider.  Follow instructions from your health care provider about eating  or drinking restrictions.  Continue to take your prenatal vitamins as told by your health care provider. If you are having trouble taking your prenatal vitamins, talk with your health care provider about different options.  Keep all follow-up and pre-birth (prenatal) visits as told by your health care provider. This is important. Contact a health care provider if:  You have pain in your abdomen.  You have a severe headache.  You have vision problems.  You are losing weight.  You feel weak or dizzy. Get help right away if:  You cannot drink fluids without vomiting.  You vomit blood.  You have constant nausea and vomiting.  You are very weak.  You faint.  You have a fever and your symptoms suddenly get worse. Summary  Hyperemesis gravidarum is a severe form of nausea and vomiting that happens during pregnancy.  Making some changes to your eating habits may help relieve nausea and vomiting.  This condition may be managed with medicine.  If medicines do not help relieve nausea and vomiting, you may need to receive fluids through an IV at the hospital. This information is not intended to replace advice given to you by your health care provider. Make sure you discuss any questions you have with your health care provider. Document Revised: 12/01/2017 Document Reviewed: 07/10/2016 Elsevier Patient Education  2020 [employer].

## 2020-06-23 LAB — CBC/D/PLT+RPR+RH+ABO+RUB AB...
Antibody Screen: NEGATIVE
Basophils Absolute: 0 10*3/uL (ref 0.0–0.2)
Basos: 0 %
EOS (ABSOLUTE): 0.1 10*3/uL (ref 0.0–0.4)
Eos: 1 %
HCV Ab: <0.1 s/co ratio (ref 0.0–0.9)
HIV Screen 4th Generation wRfx: NONREACTIVE
Hematocrit: 37.4 % (ref 34.0–46.6)
Hemoglobin: 12.9 g/dL (ref 11.1–15.9)
Hepatitis B Surface Ag: NEGATIVE
Immature Grans (Abs): 0 10*3/uL (ref 0.0–0.1)
Immature Granulocytes: 0 %
Lymphocytes Absolute: 1.6 10*3/uL (ref 0.7–3.1)
Lymphs: 29 %
MCH: 29.8 pg (ref 26.6–33.0)
MCHC: 34.5 g/dL (ref 31.5–35.7)
MCV: 86 fL (ref 79–97)
Monocytes Absolute: 0.5 10*3/uL (ref 0.1–0.9)
Monocytes: 9 %
Neutrophils Absolute: 3.3 10*3/uL (ref 1.4–7.0)
Neutrophils: 61 %
Platelets: 204 10*3/uL (ref 150–450)
RBC: 4.33 x10E6/uL (ref 3.77–5.28)
RDW: 12.3 % (ref 11.7–15.4)
RPR Ser Ql: NONREACTIVE
Rh Factor: POSITIVE
Rubella Antibodies, IGG: 2.05 index (ref >0.99)
WBC: 5.6 10*3/uL (ref 3.4–10.8)

## 2020-06-23 LAB — CERVICOVAGINAL ANCILLARY ONLY
Bacterial Vaginitis (gardnerella): NEGATIVE
Candida Glabrata: NEGATIVE
Candida Vaginitis: POSITIVE — AB
Chlamydia: NEGATIVE
Comment: NEGATIVE
Comment: NEGATIVE
Comment: NEGATIVE
Comment: NEGATIVE
Comment: NEGATIVE
Comment: NORMAL
Neisseria Gonorrhea: NEGATIVE
Trichomonas: NEGATIVE

## 2020-06-23 LAB — BASIC METABOLIC PANEL
BUN/Creatinine Ratio: 14 (ref 9–23)
BUN: 7 mg/dL (ref 6–20)
CO2: 20 mmol/L (ref 20–29)
Calcium: 9.3 mg/dL (ref 8.7–10.2)
Chloride: 103 mmol/L (ref 96–106)
Creatinine, Ser: 0.51 mg/dL — ABNORMAL LOW (ref 0.57–1.00)
GFR calc Af Amer: 159 mL/min/{1.73_m2} (ref >59)
GFR calc non Af Amer: 138 mL/min/{1.73_m2} (ref >59)
Glucose: 88 mg/dL (ref 65–99)
Potassium: 4 mmol/L (ref 3.5–5.2)
Sodium: 137 mmol/L (ref 134–144)

## 2020-06-23 LAB — CYTOLOGY - PAP
Chlamydia: NEGATIVE
Comment: NEGATIVE
Comment: NORMAL
Diagnosis: NEGATIVE
Neisseria Gonorrhea: NEGATIVE

## 2020-06-23 LAB — TSH: TSH: 1.47 u[IU]/mL (ref 0.450–4.500)

## 2020-06-23 LAB — HCV INTERPRETATION

## 2020-06-24 LAB — URINE CULTURE, OB REFLEX

## 2020-06-24 LAB — CULTURE, OB URINE

## 2020-06-26 ENCOUNTER — Telehealth: Payer: Self-pay

## 2020-06-26 ENCOUNTER — Other Ambulatory Visit: Payer: Self-pay

## 2020-06-26 DIAGNOSIS — B379 Candidiasis, unspecified: Secondary | ICD-10-CM

## 2020-06-26 MED ORDER — TERCONAZOLE 0.8 % VA CREA: 1.0000 | TOPICAL_CREAM | Freq: Every day | VAGINAL | 0 refills | Status: DC

## 2020-06-26 NOTE — Progress Notes (Signed)
tr

## 2020-06-26 NOTE — Telephone Encounter (Signed)
Called pt twice to make aware of results.  LVM  Sent Mychart message to let pt know Rx was sent for yeast infection.

## 2020-06-26 NOTE — Telephone Encounter (Signed)
-----   Message from Warden Fillers, MD sent at 06/26/2020  8:22 AM EDT ----- Prenatal labs were normal, yeast noted on swab, treat with terazol cream

## 2020-06-27 ENCOUNTER — Encounter: Payer: Self-pay | Admitting: Obstetrics and Gynecology

## 2020-07-20 ENCOUNTER — Other Ambulatory Visit: Payer: Self-pay

## 2020-07-20 ENCOUNTER — Ambulatory Visit (INDEPENDENT_AMBULATORY_CARE_PROVIDER_SITE_OTHER): Payer: Medicaid Other | Admitting: Women's Health

## 2020-07-20 VITALS — BP 119/76 | HR 99 | Wt 149.0 lb

## 2020-07-20 DIAGNOSIS — E669 Obesity, unspecified: Secondary | ICD-10-CM

## 2020-07-20 DIAGNOSIS — Z3480 Encounter for supervision of other normal pregnancy, unspecified trimester: Secondary | ICD-10-CM

## 2020-07-20 NOTE — Patient Instructions (Addendum)
Maternity Assessment Unit (MAU)  The Maternity Assessment Unit (MAU) is located at the [facility] at [facility]. The address is: [address] [place], [place], [place] [address]. Please see map below for additional directions.    The Maternity Assessment Unit is designed to help you during your pregnancy, and for up to 6 weeks after delivery, with any pregnancy- or postpartum-related emergencies, if you think you are in labor, or if your water has broken. For example, if you experience nausea and vomiting, vaginal bleeding, severe abdominal or pelvic pain, elevated blood pressure or other problems related to your pregnancy or postpartum time, please come to the Maternity Assessment Unit for assistance.        Second Trimester of Pregnancy The second trimester is from week 14 through week 27 (months 4 through 6). The second trimester is often a time when you feel your best. Your body has adjusted to being pregnant, and you begin to feel better physically. Usually, morning sickness has lessened or quit completely, you may have more energy, and you may have an increase in appetite. The second trimester is also a time when the fetus is growing rapidly. At the end of the sixth month, the fetus is about 9 inches long and weighs about 1 pounds. You will likely begin to feel the baby move (quickening) between 16 and 20 weeks of pregnancy. Body changes during your second trimester Your body continues to go through many changes during your second trimester. The changes vary from woman to woman.  Your weight will continue to increase. You will notice your lower abdomen bulging out.  You may begin to get stretch marks on your hips, abdomen, and breasts.  You may develop headaches that can be relieved by medicines. The medicines should be approved by your health care provider.  You may urinate more often because the fetus is pressing on your bladder.  You may  develop or continue to have heartburn as a result of your pregnancy.  You may develop constipation because certain hormones are causing the muscles that push waste through your intestines to slow down.  You may develop hemorrhoids or swollen, bulging veins (varicose veins).  You may have back pain. This is caused by: ? Weight gain. ? Pregnancy hormones that are relaxing the joints in your pelvis. ? A shift in weight and the muscles that support your balance.  Your breasts will continue to grow and they will continue to become tender.  Your gums may bleed and may be sensitive to brushing and flossing.  Dark spots or blotches (chloasma, mask of pregnancy) may develop on your face. This will likely fade after the baby is born.  A dark line from your belly button to the pubic area (linea nigra) may appear. This will likely fade after the baby is born.  You may have changes in your hair. These can include thickening of your hair, rapid growth, and changes in texture. Some women also have hair loss during or after pregnancy, or hair that feels dry or thin. Your hair will most likely return to normal after your baby is born. What to expect at prenatal visits During a routine prenatal visit:  You will be weighed to make sure you and the fetus are growing normally.  Your blood pressure will be taken.  Your abdomen will be measured to track your baby's growth.  The fetal heartbeat will be listened to.  Any test results from the previous visit  will be discussed. Your health care provider may ask you:  How you are feeling.  If you are feeling the baby move.  If you have had any abnormal symptoms, such as leaking fluid, bleeding, severe headaches, or abdominal cramping.  If you are using any tobacco products, including cigarettes, chewing tobacco, and electronic cigarettes.  If you have any questions. Other tests that may be performed during your second trimester include:  Blood tests  that check for: ? Low iron levels (anemia). ? High blood sugar that affects pregnant women (gestational diabetes) between 67 and 28 weeks. ? Rh antibodies. This is to check for a protein on red blood cells (Rh factor).  Urine tests to check for infections, diabetes, or protein in the urine.  An ultrasound to confirm the proper growth and development of the baby.  An amniocentesis to check for possible genetic problems.  Fetal screens for spina bifida and Down syndrome.  HIV (human immunodeficiency virus) testing. Routine prenatal testing includes screening for HIV, unless you choose not to have this test. Follow these instructions at home: Medicines  Follow your health care provider's instructions regarding medicine use. Specific medicines may be either safe or unsafe to take during pregnancy.  Take a prenatal vitamin that contains at least 600 micrograms (mcg) of folic acid.  If you develop constipation, try taking a stool softener if your health care provider approves. Eating and drinking   Eat a balanced diet that includes fresh fruits and vegetables, whole grains, good sources of protein such as meat, eggs, or tofu, and low-fat dairy. Your health care provider will help you determine the amount of weight gain that is right for you.  Avoid raw meat and uncooked cheese. These carry germs that can cause birth defects in the baby.  If you have low calcium intake from food, talk to your health care provider about whether you should take a daily calcium supplement.  Limit foods that are high in fat and processed sugars, such as fried and sweet foods.  To prevent constipation: ? Drink enough fluid to keep your urine clear or pale yellow. ? Eat foods that are high in fiber, such as fresh fruits and vegetables, whole grains, and beans. Activity  Exercise only as directed by your health care provider. Most women can continue their usual exercise routine during pregnancy. Try to  exercise for 30 minutes at least 5 days a week. Stop exercising if you experience uterine contractions.  Avoid heavy lifting, wear low heel shoes, and practice good posture.  A sexual relationship may be continued unless your health care provider directs you otherwise. Relieving pain and discomfort  Wear a good support bra to prevent discomfort from breast tenderness.  Take warm sitz baths to soothe any pain or discomfort caused by hemorrhoids. Use hemorrhoid cream if your health care provider approves.  Rest with your legs elevated if you have leg cramps or low back pain.  If you develop varicose veins, wear support hose. Elevate your feet for 15 minutes, 3-4 times a day. Limit salt in your diet. Prenatal Care  Write down your questions. Take them to your prenatal visits.  Keep all your prenatal visits as told by your health care provider. This is important. Safety  Wear your seat belt at all times when driving.  Make a list of emergency phone numbers, including numbers for family, friends, the hospital, and police and fire departments. General instructions  Ask your health care provider for a referral to a  local prenatal education class. Begin classes no later than the beginning of month 6 of your pregnancy.  Ask for help if you have counseling or nutritional needs during pregnancy. Your health care provider can offer advice or refer you to specialists for help with various needs.  Do not use hot tubs, steam rooms, or saunas.  Do not douche or use tampons or scented sanitary pads.  Do not cross your legs for long periods of time.  Avoid cat litter boxes and soil used by cats. These carry germs that can cause birth defects in the baby and possibly loss of the fetus by miscarriage or stillbirth.  Avoid all smoking, herbs, alcohol, and unprescribed drugs. Chemicals in these products can affect the formation and growth of the baby.  Do not use any products that contain nicotine  or tobacco, such as cigarettes and e-cigarettes. If you need help quitting, ask your health care provider.  Visit your dentist if you have not gone yet during your pregnancy. Use a soft toothbrush to brush your teeth and be gentle when you floss. Contact a health care provider if:  You have dizziness.  You have mild pelvic cramps, pelvic pressure, or nagging pain in the abdominal area.  You have persistent nausea, vomiting, or diarrhea.  You have a bad smelling vaginal discharge.  You have pain when you urinate. Get help right away if:  You have a fever.  You are leaking fluid from your vagina.  You have spotting or bleeding from your vagina.  You have severe abdominal cramping or pain.  You have rapid weight gain or weight loss.  You have shortness of breath with chest pain.  You notice sudden or extreme swelling of your face, hands, ankles, feet, or legs.  You have not felt your baby move in over an hour.  You have severe headaches that do not go away when you take medicine.  You have vision changes. Summary  The second trimester is from week 14 through week 27 (months 4 through 6). It is also a time when the fetus is growing rapidly.  Your body goes through many changes during pregnancy. The changes vary from woman to woman.  Avoid all smoking, herbs, alcohol, and unprescribed drugs. These chemicals affect the formation and growth your baby.  Do not use any tobacco products, such as cigarettes, chewing tobacco, and e-cigarettes. If you need help quitting, ask your health care provider.  Contact your health care provider if you have any questions. Keep all prenatal visits as told by your health care provider. This is important. This information is not intended to replace advice given to you by your health care provider. Make sure you discuss any questions you have with your health care provider. Document Revised: 03/05/2019 Document Reviewed: 12/17/2016 Elsevier  Patient Education  2020 Elsevier Inc.        Round Ligament Pain  The round ligament is a cord of muscle and tissue that helps support the uterus. It can become a source of pain during pregnancy if it becomes stretched or twisted as the baby grows. The pain usually begins in the second trimester (13-28 weeks) of pregnancy, and it can come and go until the baby is delivered. It is not a serious problem, and it does not cause harm to the baby. Round ligament pain is usually a short, sharp, and pinching pain, but it can also be a dull, lingering, and aching pain. The pain is felt in the lower side of  the abdomen or in the groin. It usually starts deep in the groin and moves up to the outside of the hip area. The pain may occur when you:  Suddenly change position, such as quickly going from a sitting to standing position.  Roll over in bed.  Cough or sneeze.  Do physical activity. Follow these instructions at home:   Watch your condition for any changes.  When the pain starts, relax. Then try any of these methods to help with the pain: ? Sitting down. ? Flexing your knees up to your abdomen. ? Lying on your side with one pillow under your abdomen and another pillow between your legs. ? Sitting in a warm bath for 15-20 minutes or until the pain goes away.  Take over-the-counter and prescription medicines only as told by your health care provider.  Move slowly when you sit down or stand up.  Avoid long walks if they cause pain.  Stop or reduce your physical activities if they cause pain.  Keep all follow-up visits as told by your health care provider. This is important. Contact a health care provider if:  Your pain does not go away with treatment.  You feel pain in your back that you did not have before.  Your medicine is not helping. Get help right away if:  You have a fever or chills.  You develop uterine contractions.  You have vaginal bleeding.  You have nausea  or vomiting.  You have diarrhea.  You have pain when you urinate. Summary  Round ligament pain is felt in the lower abdomen or groin. It is usually a short, sharp, and pinching pain. It can also be a dull, lingering, and aching pain.  This pain usually begins in the second trimester (13-28 weeks). It occurs because the uterus is stretching with the growing baby, and it is not harmful to the baby.  You may notice the pain when you suddenly change position, when you cough or sneeze, or during physical activity.  Relaxing, flexing your knees to your abdomen, lying on one side, or taking a warm bath may help to get rid of the pain.  Get help from your health care provider if the pain does not go away or if you have vaginal bleeding, nausea, vomiting, diarrhea, or painful urination. This information is not intended to replace advice given to you by your health care provider. Make sure you discuss any questions you have with your health care provider. Document Revised: 04/29/2018 Document Reviewed: 04/29/2018 Elsevier Patient Education  2020 [employer].        Alpha-Fetoprotein Test Why am I having this test? The alpha-fetoprotein test is most commonly used in pregnant women to help screen for birth defects in their unborn baby. It can be used to screen for birth defects, such as chromosome (DNA) abnormalities, problems with the brain or spinal cord, or problems with the abdominal wall of the unborn baby (fetus). The alpha-fetoprotein test may also be done for men or non-pregnant women to check for certain cancers. What is being tested? This test measures the amount of alpha-fetoprotein (AFP) in your blood. AFP is a protein that is made by the liver. Levels can be detected in the mother's blood during pregnancy, starting at 10 weeks and peaking at 16-18 weeks of the pregnancy. Abnormal levels can sometimes be a sign of a birth defect in the baby. Certain cancers can cause a high level of  AFP in men and non-pregnant women. What kind of  sample is taken?  A blood sample is required for this test. It is usually collected by inserting a needle into a blood vessel. How are the results reported? Your test results will be reported as values. Your health care provider will compare your results to normal ranges that were established after testing a large group of people (reference values). Reference values may vary among labs and hospitals. For this test, common reference values are:  Adult: Less than 40 ng/mL or less than 40 mcg/L (SI units).  Child younger than 1 year: Less than 30 ng/mL. If you are pregnant, the values may also vary based on how long you have been pregnant. What do the results mean? Results that are above the reference values in pregnant women may indicate the following for the baby:  Neural tube defects, such as abnormalities of the spinal cord or brain.  Abdominal wall defects.  Multiple pregnancy such as twins.  Fetal distress or fetal death. Results that are above the reference values in men or non-pregnant women may indicate:  Reproductive cancers, such as ovarian or testicular cancer.  Liver cancer.  Liver cell death.  Other types of cancer. Very low levels of AFP in pregnant women may indicate the following for the baby:  Down syndrome.  Fetal death. Talk with your health care provider about what your results mean. Questions to ask your health care provider Ask your health care provider, or the department that is doing the test:  When will my results be ready?  How will I get my results?  What are my treatment options?  What other tests do I need?  What are my next steps? Summary  The alpha-fetoprotein test is done on pregnant women to help screen for birth defects in their unborn baby.  Certain cancers can cause a high level of AFP in men and non-pregnant women.  For this test, a blood sample is usually collected by inserting a  needle into a blood vessel.  Talk with your health care provider about what your results mean. This information is not intended to replace advice given to you by your health care provider. Make sure you discuss any questions you have with your health care provider. Document Revised: 10/24/2017 Document Reviewed: 06/17/2017 Elsevier Patient Education  2020 [employer].       Etonogestrel implant What is this medicine? ETONOGESTREL (et oh noe JES trel) is a contraceptive (birth control) device. It is used to prevent pregnancy. It can be used for up to 3 years. This medicine may be used for other purposes; ask your health care provider or pharmacist if you have questions. COMMON BRAND NAME(S): Implanon, Nexplanon What should I tell my health care provider before I take this medicine? They need to know if you have any of these conditions:  abnormal vaginal bleeding  blood vessel disease or blood clots  breast, cervical, endometrial, ovarian, liver, or uterine cancer  diabetes  gallbladder disease  heart disease or recent heart attack  high blood pressure  high cholesterol or triglycerides  kidney disease  liver disease  migraine headaches  seizures  stroke  tobacco smoker  an unusual or allergic reaction to etonogestrel, anesthetics or antiseptics, other medicines, foods, dyes, or preservatives  pregnant or trying to get pregnant  breast-feeding How should I use this medicine? This device is inserted just under the skin on the inner side of your upper arm by a health care professional. Talk to your pediatrician regarding the use of this  medicine in children. Special care may be needed. Overdosage: If you think you have taken too much of this medicine contact a poison control center or emergency room at once. NOTE: This medicine is only for you. Do not share this medicine with others. What if I miss a dose? This does not apply. What may interact with this  medicine? Do not take this medicine with any of the following medications:  amprenavir  fosamprenavir This medicine may also interact with the following medications:  acitretin  aprepitant  armodafinil  bexarotene  bosentan  carbamazepine  certain medicines for fungal infections like fluconazole, ketoconazole, itraconazole and voriconazole  certain medicines to treat hepatitis, HIV or AIDS  cyclosporine  felbamate  griseofulvin  lamotrigine  modafinil  oxcarbazepine  phenobarbital  phenytoin  primidone  rifabutin  rifampin  rifapentine  St. John's wort  topiramate This list may not describe all possible interactions. Give your health care provider a list of all the medicines, herbs, non-prescription drugs, or dietary supplements you use. Also tell them if you smoke, drink alcohol, or use illegal drugs. Some items may interact with your medicine. What should I watch for while using this medicine? This product does not protect you against HIV infection (AIDS) or other sexually transmitted diseases. You should be able to feel the implant by pressing your fingertips over the skin where it was inserted. Contact your doctor if you cannot feel the implant, and use a non-hormonal birth control method (such as condoms) until your doctor confirms that the implant is in place. Contact your doctor if you think that the implant may have broken or become bent while in your arm. You will receive a user card from your health care provider after the implant is inserted. The card is a record of the location of the implant in your upper arm and when it should be removed. Keep this card with your health records. What side effects may I notice from receiving this medicine? Side effects that you should report to your doctor or health care professional as soon as possible:  allergic reactions like skin rash, itching or hives, swelling of the face, lips, or tongue  breast lumps,  breast tissue changes, or discharge  breathing problems  changes in emotions or moods  coughing up blood  if you feel that the implant may have broken or bent while in your arm  high blood pressure  pain, irritation, swelling, or bruising at the insertion site  scar at site of insertion  signs of infection at the insertion site such as fever, and skin redness, pain or discharge  signs and symptoms of a blood clot such as breathing problems; changes in vision; chest pain; severe, sudden headache; pain, swelling, warmth in the leg; trouble speaking; sudden numbness or weakness of the face, arm or leg  signs and symptoms of liver injury like dark yellow or brown urine; general ill feeling or flu-like symptoms; light-colored stools; loss of appetite; nausea; right upper belly pain; unusually weak or tired; yellowing of the eyes or skin  unusual vaginal bleeding, discharge Side effects that usually do not require medical attention (report to your doctor or health care professional if they continue or are bothersome):  acne  breast pain or tenderness  headache  irregular menstrual bleeding  nausea This list may not describe all possible side effects. Call your doctor for medical advice about side effects. You may report side effects to FDA at 1-800-FDA-1088. Where should I keep my medicine?  This drug is given in a hospital or clinic and will not be stored at home. NOTE: This sheet is a summary. It may not cover all possible information. If you have questions about this medicine, talk to your doctor, pharmacist, or health care provider.  2020 Elsevier/Gold Standard (2019-08-24 11:33:04)

## 2020-07-20 NOTE — Progress Notes (Signed)
ROB  CC: pt states [place] prescription PNV's cause nausea and vomiting.

## 2020-07-20 NOTE — Progress Notes (Signed)
Subjective:  Kristie Mcintosh is a 19 y.o. G2P1001 at [redacted]w[redacted]d being seen today for ongoing prenatal care.  She is currently monitored for the following issues for this low-risk pregnancy and has Tension headache; Migraine without aura and without status migrainosus, not intractable; Insomnia; Circadian rhythm sleep disorder, irregular sleep wake type; ADD (attention deficit disorder); Obesity (BMI 30-39.9); Bacterial vaginitis; Enlarged tonsils; Encounter for supervision of normal pregnancy, antepartum; and Nausea and vomiting during pregnancy on their problem list.  Patient reports no complaints.  Contractions: Not present. Vag. Bleeding: None.  Movement: Present. Denies leaking of fluid.   The following portions of the patient's history were reviewed and updated as appropriate: allergies, current medications, past family history, past medical history, past social history, past surgical history and problem list. Problem list updated.  Objective:   Vitals:   07/20/20 1454  BP: 119/76  Pulse: 99  Weight: 149 lb (67.6 kg)    Fetal Status: Fetal Heart Rate (bpm): 152   Movement: Present     General:  Alert, oriented and cooperative. Patient is in no acute distress.  Skin: Skin is warm and dry. No rash noted.   Cardiovascular: Normal heart rate noted  Respiratory: Normal respiratory effort, no problems with respiration noted  Abdomen: Soft, gravid, appropriate for gestational age. Pain/Pressure: Absent     Pelvic: Vag. Bleeding: None     Cervical exam deferred        Extremities: Normal range of motion.  Edema: None  Mental Status: Normal mood and affect. Normal behavior. Normal judgment and thought content.   Urinalysis:      Assessment and Plan:  Pregnancy: G2P1001 at [redacted]w[redacted]d  1. Supervision of other normal pregnancy, antepartum - anatomy US to be scheduled today - discussed contraception, pt elects in-hospital Nexplanon - AFP, Serum, Open Spina Bifida  2. Obesity (BMI 30-39.9) - A1C  today  Preterm labor symptoms and general obstetric precautions including but not limited to vaginal bleeding, contractions, leaking of fluid and fetal movement were reviewed in detail with the patient. I discussed the assessment and treatment plan with the patient. The patient was provided an opportunity to ask questions and all were answered. The patient agreed with the plan and demonstrated an understanding of the instructions. The patient was advised to call back or seek an in-person office evaluation/go to MAU at [facility] for any urgent or concerning symptoms. Please refer to After Visit Summary for other counseling recommendations.  Return in about 4 weeks (around 08/17/2020) for in-person LOB/APP OK, needs anatomy scan scheduled.   Ytzel Gubler, Odie Sera, NP

## 2020-07-22 LAB — AFP, SERUM, OPEN SPINA BIFIDA
AFP MoM: 0.78
AFP Value: 27.3 ng/mL
Gest. Age on Collection Date: 15.4 weeks
Maternal Age At EDD: 21.8 yr
OSBR Risk 1 IN: 10000
Test Results:: NEGATIVE
Weight: 149 [lb_av]

## 2020-07-22 LAB — HEMOGLOBIN A1C
Est. average glucose Bld gHb Est-mCnc: 103 mg/dL
Hgb A1c MFr Bld: 5.2 % (ref 4.8–5.6)

## 2020-08-17 ENCOUNTER — Encounter: Payer: Medicaid Other | Admitting: Nurse Practitioner

## 2020-08-21 ENCOUNTER — Ambulatory Visit: Payer: Medicaid Other

## 2020-08-23 ENCOUNTER — Encounter: Payer: Medicaid Other | Admitting: Obstetrics and Gynecology

## 2020-08-24 ENCOUNTER — Ambulatory Visit: Payer: Medicaid Other | Attending: Women's Health

## 2020-08-24 ENCOUNTER — Other Ambulatory Visit: Payer: Self-pay

## 2020-08-24 ENCOUNTER — Other Ambulatory Visit: Payer: Self-pay | Admitting: Women's Health

## 2020-08-24 DIAGNOSIS — Z3480 Encounter for supervision of other normal pregnancy, unspecified trimester: Secondary | ICD-10-CM

## 2020-08-24 DIAGNOSIS — O99212 Obesity complicating pregnancy, second trimester: Secondary | ICD-10-CM | POA: Insufficient documentation

## 2020-09-04 ENCOUNTER — Ambulatory Visit (INDEPENDENT_AMBULATORY_CARE_PROVIDER_SITE_OTHER): Payer: Medicaid Other | Admitting: Advanced Practice Midwife

## 2020-09-04 ENCOUNTER — Encounter: Payer: Self-pay | Admitting: Advanced Practice Midwife

## 2020-09-04 ENCOUNTER — Other Ambulatory Visit: Payer: Self-pay

## 2020-09-04 VITALS — BP 111/69 | HR 69 | Wt 153.0 lb

## 2020-09-04 DIAGNOSIS — R102 Pelvic and perineal pain: Secondary | ICD-10-CM

## 2020-09-04 DIAGNOSIS — Z3480 Encounter for supervision of other normal pregnancy, unspecified trimester: Secondary | ICD-10-CM

## 2020-09-04 DIAGNOSIS — O26892 Other specified pregnancy related conditions, second trimester: Secondary | ICD-10-CM

## 2020-09-04 DIAGNOSIS — Z3A22 22 weeks gestation of pregnancy: Secondary | ICD-10-CM

## 2020-09-04 DIAGNOSIS — N393 Stress incontinence (female) (male): Secondary | ICD-10-CM

## 2020-09-04 MED ORDER — COMFORT FIT MATERNITY SUPP MED MISC: 1.0000 | Freq: Every day | 0 refills | Status: DC

## 2020-09-04 NOTE — Progress Notes (Signed)
Pt presents for ROB c/o Prenate Mini causing N&V Pt requests a different PNV

## 2020-09-04 NOTE — Progress Notes (Signed)
   PRENATAL VISIT NOTE  Subjective:  Kristie Mcintosh is a 19 y.o. G2P1001 at [redacted]w[redacted]d being seen today for ongoing prenatal care.  She is currently monitored for the following issues for this low-risk pregnancy and has Tension headache; Migraine without aura and without status migrainosus, not intractable; Insomnia; Circadian rhythm sleep disorder, irregular sleep wake type; ADD (attention deficit disorder); Obesity (BMI 30-39.9); Bacterial vaginitis; Enlarged tonsils; Encounter for supervision of normal pregnancy, antepartum; and Nausea and vomiting during pregnancy on their problem list.  Patient reports pelvic pressure and stress incontinence.  Contractions: Not present. Vag. Bleeding: None.  Movement: Present. Denies leaking of fluid.   The following portions of the patient's history were reviewed and updated as appropriate: allergies, current medications, past family history, past medical history, past social history, past surgical history and problem list.   Objective:   Vitals:   09/04/20 1414  BP: 111/69  Pulse: 69  Weight: 153 lb (69.4 kg)    Fetal Status: Fetal Heart Rate (bpm): 164   Movement: Present     General:  Alert, oriented and cooperative. Patient is in no acute distress.  Skin: Skin is warm and dry. No rash noted.   Cardiovascular: Normal heart rate noted  Respiratory: Normal respiratory effort, no problems with respiration noted  Abdomen: Soft, gravid, appropriate for gestational age.  Pain/Pressure: Absent     Pelvic: Cervical exam deferred        Extremities: Normal range of motion.  Edema: None  Mental Status: Normal mood and affect. Normal behavior. Normal judgment and thought content.   Assessment and Plan:  Pregnancy: G2P1001 at [redacted]w[redacted]d 1. Supervision of other normal pregnancy, antepartum --Anticipatory guidance about next visits/weeks of pregnancy given. --Next visit in 4 weeks for GTT  2. [redacted] weeks gestation of pregnancy   3. Pelvic pain affecting  pregnancy in second trimester, antepartum --Increased pressure with this pregnancy, no regular cramping/contractions --Rest/ice/heat/warm bath/Tylenol/pregnancy support belt  - Elastic Bandages & Supports (COMFORT FIT MATERNITY SUPP MED) MISC; 1 Device by Does not apply route daily.  Dispense: 1 each; Refill: 0 - Ambulatory referral to Physical Therapy  4. Urinary, incontinence, stress female --Stress with cough/laugh/sneeze.  Pt doing Kegels, discussed PT as option to improve now and PP. - Ambulatory referral to Physical Therapy  Preterm labor symptoms and general obstetric precautions including but not limited to vaginal bleeding, contractions, leaking of fluid and fetal movement were reviewed in detail with the patient. Please refer to After Visit Summary for other counseling recommendations.   No follow-ups on file.  No future appointments.  Sharen Counter, CNM

## 2020-09-04 NOTE — Patient Instructions (Signed)

## 2020-09-18 ENCOUNTER — Inpatient Hospital Stay (HOSPITAL_COMMUNITY)
Admission: AD | Admit: 2020-09-18 | Discharge: 2020-09-18 | Disposition: A | Discharge status: Home / Self Care | Payer: Medicaid Other | Attending: Family Medicine | Admitting: Family Medicine

## 2020-09-18 ENCOUNTER — Encounter (HOSPITAL_COMMUNITY): Payer: Self-pay | Admitting: Obstetrics & Gynecology

## 2020-09-18 ENCOUNTER — Inpatient Hospital Stay (HOSPITAL_BASED_OUTPATIENT_CLINIC_OR_DEPARTMENT_OTHER): Payer: Medicaid Other

## 2020-09-18 DIAGNOSIS — Z880 Allergy status to penicillin: Secondary | ICD-10-CM | POA: Diagnosis not present

## 2020-09-18 DIAGNOSIS — Z3A24 24 weeks gestation of pregnancy: Secondary | ICD-10-CM | POA: Diagnosis not present

## 2020-09-18 DIAGNOSIS — O26892 Other specified pregnancy related conditions, second trimester: Secondary | ICD-10-CM

## 2020-09-18 DIAGNOSIS — O99212 Obesity complicating pregnancy, second trimester: Secondary | ICD-10-CM | POA: Diagnosis not present

## 2020-09-18 DIAGNOSIS — E669 Obesity, unspecified: Secondary | ICD-10-CM

## 2020-09-18 DIAGNOSIS — Z3480 Encounter for supervision of other normal pregnancy, unspecified trimester: Secondary | ICD-10-CM

## 2020-09-18 DIAGNOSIS — Z87891 Personal history of nicotine dependence: Secondary | ICD-10-CM | POA: Insufficient documentation

## 2020-09-18 DIAGNOSIS — M549 Dorsalgia, unspecified: Secondary | ICD-10-CM | POA: Insufficient documentation

## 2020-09-18 DIAGNOSIS — R102 Pelvic and perineal pain: Secondary | ICD-10-CM | POA: Diagnosis present

## 2020-09-18 DIAGNOSIS — O26899 Other specified pregnancy related conditions, unspecified trimester: Secondary | ICD-10-CM

## 2020-09-18 LAB — COMPREHENSIVE METABOLIC PANEL
ALT: 9 U/L (ref 0–44)
AST: 19 U/L (ref 15–41)
Albumin: 3.4 g/dL — ABNORMAL LOW (ref 3.5–5.0)
Alkaline Phosphatase: 64 U/L (ref 38–126)
Anion gap: 10 (ref 5–15)
BUN: <5 mg/dL — ABNORMAL LOW (ref 6–20)
CO2: 20 mmol/L — ABNORMAL LOW (ref 22–32)
Calcium: 9.1 mg/dL (ref 8.9–10.3)
Chloride: 106 mmol/L (ref 98–111)
Creatinine, Ser: 0.53 mg/dL (ref 0.44–1.00)
GFR, Estimated: >60 mL/min (ref >60)
Glucose, Bld: 79 mg/dL (ref 70–99)
Potassium: 3.9 mmol/L (ref 3.5–5.1)
Sodium: 136 mmol/L (ref 135–145)
Total Bilirubin: 0.4 mg/dL (ref 0.3–1.2)
Total Protein: 7 g/dL (ref 6.5–8.1)

## 2020-09-18 LAB — CBC
HCT: 36.3 % (ref 36.0–46.0)
Hemoglobin: 11.8 g/dL — ABNORMAL LOW (ref 12.0–15.0)
MCH: 29.7 pg (ref 26.0–34.0)
MCHC: 32.5 g/dL (ref 30.0–36.0)
MCV: 91.4 fL (ref 80.0–100.0)
Platelets: 202 10*3/uL (ref 150–400)
RBC: 3.97 MIL/uL (ref 3.87–5.11)
RDW: 12.8 % (ref 11.5–15.5)
WBC: 11.2 10*3/uL — ABNORMAL HIGH (ref 4.0–10.5)
nRBC: 0 % (ref 0.0–0.2)

## 2020-09-18 LAB — WET PREP, GENITAL
Clue Cells Wet Prep HPF POC: NONE SEEN
Sperm: NONE SEEN
Trich, Wet Prep: NONE SEEN
Yeast Wet Prep HPF POC: NONE SEEN

## 2020-09-18 LAB — URINALYSIS, ROUTINE W REFLEX MICROSCOPIC
Bilirubin Urine: NEGATIVE
Glucose, UA: NEGATIVE mg/dL
Hgb urine dipstick: NEGATIVE
Ketones, ur: NEGATIVE mg/dL
Leukocytes,Ua: NEGATIVE
Nitrite: NEGATIVE
Protein, ur: NEGATIVE mg/dL
Specific Gravity, Urine: 1.008 (ref 1.005–1.030)
pH: 7 (ref 5.0–8.0)

## 2020-09-18 MED ORDER — ACETAMINOPHEN 500 MG PO TABS
1000.0000 mg | ORAL_TABLET | Freq: Once | ORAL | Status: DC
  Filled 2020-09-18: qty 2

## 2020-09-18 MED ORDER — LACTATED RINGERS IV BOLUS
1000.0000 mL | Freq: Once | INTRAVENOUS | Status: AC
  Administered 2020-09-18: 1000 mL via INTRAVENOUS

## 2020-09-18 NOTE — MAU Provider Note (Signed)
History     CSN: [id number]  Arrival date and time: 09/18/20 1601   First Provider Initiated Contact with Patient 09/18/20 1649      Chief Complaint  Patient presents with  . Abdominal Pain  . Back Pain  . Pelvic Pain  . Rupture of Membranes   HPI This 19 year old G2P1001 at [redacted]w[redacted]d. She reports pelvic pain that has been intermittent throughout her pregnancy, but worse yesterday and today. Pain radiates into her back. No vaginal bleeding. Leaking some small amount of fluid (had sex yesterday). Urinating and defecating normally. No palliating or provoking factors. Occasionally pain makes her throw up. When she gets the pain, she just tries to sleep it off.   OB History    Gravida  2   Para  1   Term  1   Preterm  0   AB  0   Living  1     SAB  0   TAB  0   Ectopic  0   Multiple  0   Live Births  1           Past Medical History:  Diagnosis Date  . Abdominal pain affecting pregnancy 05/16/2017  . Amenorrhea 05/16/2017  . Asthma   . Bacterial vaginitis 05/16/2017  . Headache(784.0)   . Obesity     Past Surgical History:  Procedure Laterality Date  . ADENOIDECTOMY AND MYRINGOTOMY WITH TUBE PLACEMENT Bilateral 2001  . WISDOM TOOTH EXTRACTION Bilateral     Family History  Problem Relation Age of Onset  . Headache Mother   . Heart disease Mother   . Migraines Maternal Grandmother   . Depression Maternal Grandmother   . Heart Problems Maternal Grandfather   . Bipolar disorder Paternal Aunt   . Autism Cousin        2 Maternal 1st Cousins have Autism  . Hyperlipidemia Other   . Diabetes Other   . Heart disease Other   . Hypertension Other     Social History   Tobacco Use  . Smoking status: Former Smoker    Packs/day: 0.25    Types: Cigarettes    Quit date: 04/25/2017    Years since quitting: 3.4  . Smokeless tobacco: Never Used  Vaping Use  . Vaping Use: Never used  Substance Use Topics  . Alcohol use: No  . Drug use: Not Currently     Allergies:  Allergies  Allergen Reactions  . Eggs Or Egg-Derived Products Anaphylaxis  . 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 Penicillin G Anaphylaxis    Has patient had a PCN reaction causing immediate rash, facial/tongue/throat swelling, SOB or lightheadedness with hypotension: yes Has patient had a PCN reaction causing severe rash involving mucus membranes or skin necrosis: yes Has patient had a PCN reaction that required hospitalization: no Has patient had a PCN reaction occurring within the last 10 years: yes If all of the above answers are "NO", then may proceed with Cephalosporin use.  . Promethazine Hives and Nausea And Vomiting    Medications Prior to Admission  Medication Sig Dispense Refill Last Dose  . ondansetron (ZOFRAN ODT) 8 MG disintegrating tablet Take 1 tablet (8 mg total) by mouth every 8 (eight) hours as needed for nausea or vomiting. 20 tablet 3 09/17/2020 at Unknown time  . Blood Pressure Monitoring (BLOOD PRESSURE KIT) DEVI 1 Device by Does not apply route as needed. (Patient not taking: Reported on 09/04/2020) 1 each 0   .  Doxylamine-Pyridoxine (DICLEGIS) 10-10 MG TBEC Take 2 tablets by mouth at bedtime. If symptoms persist, add one tablet in the morning and one in the afternoon 100 tablet 5  at not taking  . Elastic Bandages & Supports (COMFORT FIT MATERNITY SUPP MED) MISC 1 Device by Does not apply route daily. 1 each 0   . ibuprofen (ADVIL) 800 MG tablet Take 1 tablet (800 mg total) by mouth every 8 (eight) hours as needed for cramping. (Patient not taking: Reported on 07/20/2020) 15 tablet 0   . metoCLOPramide (REGLAN) 10 MG tablet Take 1 tablet (10 mg total) by mouth 4 (four) times daily as needed for nausea or vomiting. (Patient not taking: Reported on 09/04/2020) 30 tablet 2   . Misc. Devices (GOJJI WEIGHT SCALE) MISC 1 Device by Does not apply route as needed. (Patient not taking: Reported on 09/04/2020) 1 each 0   . Prenat w/o  A-FeCbn-Meth-FA-DHA (PRENATE MINI) 29-0.6-0.4-350 MG CAPS Take 1 capsule by mouth daily before breakfast. (Patient not taking: Reported on 07/20/2020) 90 capsule 3   . Prenatal Vit-Fe Fumarate-FA (WESTAB PLUS) 27-1 MG TABS TAKE 1 CAPSULE BY MOUTH DAILY BEFORE BREAKFAST (Patient not taking: Reported on 07/20/2020)     . terconazole (TERAZOL 3) 0.8 % vaginal cream Place 1 applicator vaginally at bedtime. Apply nightly for three nights. (Patient not taking: Reported on 07/20/2020) 20 g 0     Review of Systems Physical Exam   Blood pressure 112/67, pulse 68, temperature 98.6 F (37 C), resp. rate 18, height $RemoveBe'4\' 11"'SgZLJeevS$  (1.499 m), weight 76.2 kg, SpO2 100 %, currently breastfeeding.  Physical Exam Vitals reviewed. Exam conducted with a chaperone present.  Constitutional:      Appearance: She is well-developed.  Cardiovascular:     Rate and Rhythm: Normal rate and regular rhythm.     Heart sounds: Normal heart sounds. No murmur heard.  No friction rub. No gallop.   Pulmonary:     Effort: Pulmonary effort is normal. No respiratory distress.     Breath sounds: Normal breath sounds. No stridor. No wheezing.  Abdominal:     General: Abdomen is flat.     Palpations: Abdomen is soft.     Tenderness: There is abdominal tenderness in the right lower quadrant, suprapubic area and left lower quadrant. There is no right CVA tenderness, left CVA tenderness, guarding or rebound. Negative signs include Murphy's sign and McBurney's sign.     Hernia: There is no hernia in the left inguinal area or right inguinal area.  Genitourinary:    Labia:        Right: No rash, tenderness or lesion.        Left: No rash, tenderness or lesion.      Vagina: No signs of injury and foreign body. Tenderness present. No vaginal discharge or erythema.     Cervix: No cervical motion tenderness, discharge, friability or lesion.     Comments: Uncomfortable during pelvic exam Cervix closed Lymphadenopathy:     Lower Body: No right  inguinal adenopathy. No left inguinal adenopathy.  Skin:    General: Skin is warm and dry.     Capillary Refill: Capillary refill takes less than 2 seconds.  Neurological:     General: No focal deficit present.     Mental Status: She is alert.      Results for orders placed or performed during the hospital encounter of 09/18/20 (from the past 24 hour(s))  Urinalysis, Routine w reflex microscopic Urine, Clean Catch  Status: Abnormal   Collection Time: 09/18/20  4:23 PM  Result Value Ref Range   Color, Urine STRAW (A) YELLOW   APPearance CLEAR CLEAR   Specific Gravity, Urine 1.008 1.005 - 1.030   pH 7.0 5.0 - 8.0   Glucose, UA NEGATIVE NEGATIVE mg/dL   Hgb urine dipstick NEGATIVE NEGATIVE   Bilirubin Urine NEGATIVE NEGATIVE   Ketones, ur NEGATIVE NEGATIVE mg/dL   Protein, ur NEGATIVE NEGATIVE mg/dL   Nitrite NEGATIVE NEGATIVE   Leukocytes,Ua NEGATIVE NEGATIVE  Wet prep, genital     Status: Abnormal   Collection Time: 09/18/20  5:09 PM  Result Value Ref Range   Yeast Wet Prep HPF POC NONE SEEN NONE SEEN   Trich, Wet Prep NONE SEEN NONE SEEN   Clue Cells Wet Prep HPF POC NONE SEEN NONE SEEN   WBC, Wet Prep HPF POC MANY (A) NONE SEEN   Sperm NONE SEEN   CBC     Status: Abnormal   Collection Time: 09/18/20  5:30 PM  Result Value Ref Range   WBC 11.2 (H) 4.0 - 10.5 K/uL   RBC 3.97 3.87 - 5.11 MIL/uL   Hemoglobin 11.8 (L) 12.0 - 15.0 g/dL   HCT 36.3 36 - 46 %   MCV 91.4 80.0 - 100.0 fL   MCH 29.7 26.0 - 34.0 pg   MCHC 32.5 30.0 - 36.0 g/dL   RDW 12.8 11.5 - 15.5 %   Platelets 202 150 - 400 K/uL   nRBC 0.0 0.0 - 0.2 %  Comprehensive metabolic panel     Status: Abnormal   Collection Time: 09/18/20  5:30 PM  Result Value Ref Range   Sodium 136 135 - 145 mmol/L   Potassium 3.9 3.5 - 5.1 mmol/L   Chloride 106 98 - 111 mmol/L   CO2 20 (L) 22 - 32 mmol/L   Glucose, Bld 79 70 - 99 mg/dL   BUN <5 (L) 6 - 20 mg/dL   Creatinine, Ser 0.53 0.44 - 1.00 mg/dL   Calcium 9.1 8.9 -  10.3 mg/dL   Total Protein 7.0 6.5 - 8.1 g/dL   Albumin 3.4 (L) 3.5 - 5.0 g/dL   AST 19 15 - 41 U/L   ALT 9 0 - 44 U/L   Alkaline Phosphatase 64 38 - 126 U/L   Total Bilirubin 0.4 0.3 - 1.2 mg/dL   GFR, Estimated >60 >60 mL/min   Anion gap 10 5 - 15     MAU Course  Procedures FHT: 150s, mod variability, no decels  MDM No evidence of preterm labor. No evidence of infection. Low concern for appendicitis due to normal WBC and no fever.  Assessment and Plan     ICD-10-CM   1. [redacted] weeks gestation of pregnancy  Z3A.24   2. Supervision of other normal pregnancy, antepartum  Z34.80   3. Pelvic pain affecting pregnancy  O26.899 [place] MFM OB LIMITED   R10.2 [place] MFM OB LIMITED  4. Pelvic pain affecting pregnancy in second trimester, antepartum  O26.892    R10.2    IVF bolus given with improvement. Patient declined tylenol.  Feels improved and would like to go home.  Return precautions given.   Truett Mainland 09/18/2020, 5:02 PM

## 2020-09-18 NOTE — MAU Note (Signed)
Been hurting since this morning, pain in lower abd, low back, and her vagina. Feeling a lot of pressure.  Hurts to pee, hurts to sit. Denies bleeding. ? Leaking.  Was sitting in the bathroom, because she was hurting "little stuff kept leaking out, like water".

## 2020-09-18 NOTE — Discharge Instructions (Signed)
Abdominal Pain During Pregnancy  Belly (abdominal) pain is common during pregnancy. There are many possible causes. Most of the time, it is not a serious problem. Other times, it can be a sign that something is wrong with the pregnancy. Always tell your doctor if you have belly pain. Follow these instructions at home:  Do not have sex or put anything in your vagina until your pain goes away completely.  Get plenty of rest until your pain gets better.  Drink enough fluid to keep your pee (urine) pale yellow.  Take over-the-counter and prescription medicines only as told by your doctor.  Keep all follow-up visits as told by your doctor. This is important. Contact a doctor if:  Your pain continues or gets worse after resting.  You have lower belly pain that: ? Comes and goes at regular times. ? Spreads to your back. ? Feels like menstrual cramps.  You have pain or burning when you pee (urinate). Get help right away if:  You have a fever or chills.  You have vaginal bleeding.  You are leaking fluid from your vagina.  You are passing tissue from your vagina.  You throw up (vomit) for more than 24 hours.  You have watery poop (diarrhea) for more than 24 hours.  Your baby is moving less than usual.  You feel very weak or faint.  You have shortness of breath.  You have very bad pain in your upper belly. Summary  Belly (abdominal) pain is common during pregnancy. There are many possible causes.  If you have belly pain during pregnancy, tell your doctor right away.  Keep all follow-up visits as told by your doctor. This is important. This information is not intended to replace advice given to you by your health care provider. Make sure you discuss any questions you have with your health care provider. Document Revised: 03/01/2019 Document Reviewed: 02/13/2017 Elsevier Patient Education  2020 Elsevier Inc.  

## 2020-09-19 LAB — GC/CHLAMYDIA PROBE AMP (~~LOC~~) NOT AT ARMC
Chlamydia: NEGATIVE
Comment: NEGATIVE
Comment: NORMAL
Neisseria Gonorrhea: NEGATIVE

## 2020-09-22 ENCOUNTER — Encounter: Payer: Self-pay | Admitting: Obstetrics

## 2020-09-22 IMAGING — US US OB < 14 WEEKS - US OB TV
1 series · 15 of 28 positions shown · non-contrast
Comparison: None.

CLINICAL DATA: Abdominal pain.

EXAM:
OBSTETRIC <14 WK US AND TRANSVAGINAL OB US
TECHNIQUE: Both transabdominal and transvaginal ultrasound examinations were
performed for complete evaluation of the gestation as well as the
maternal uterus, adnexal regions, and pelvic cul-de-sac.
Transvaginal technique was performed to assess early pregnancy.

[Series 1: us ob < 14 weeks - us ob tv · 15 of 59 slices shown]
[im 1/59]
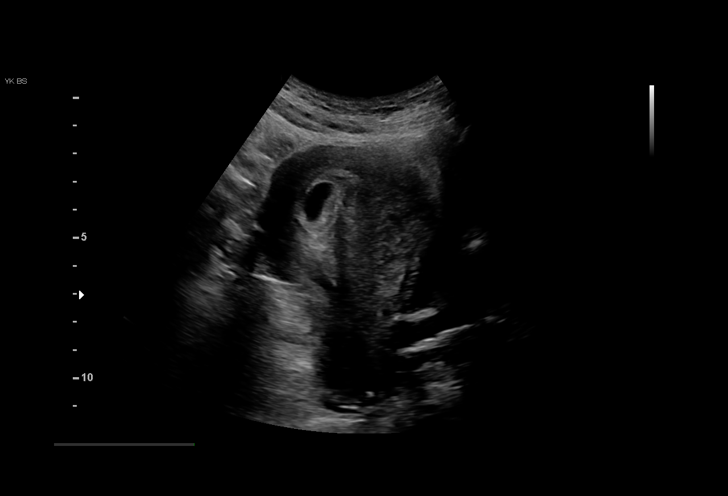
[im 5/59]
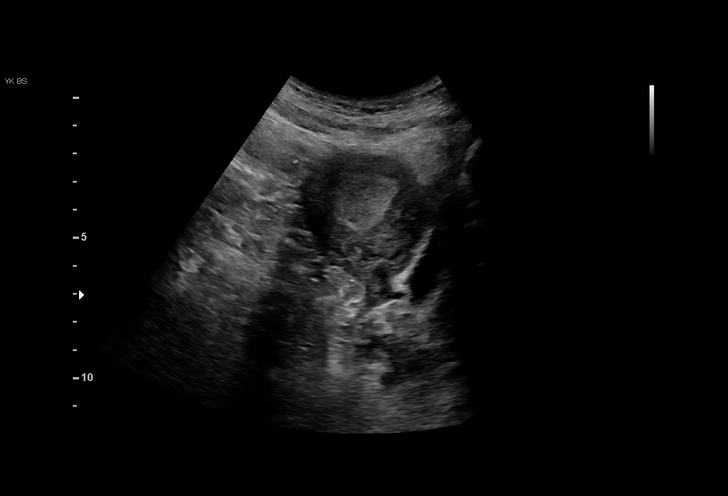
[im 9/59]
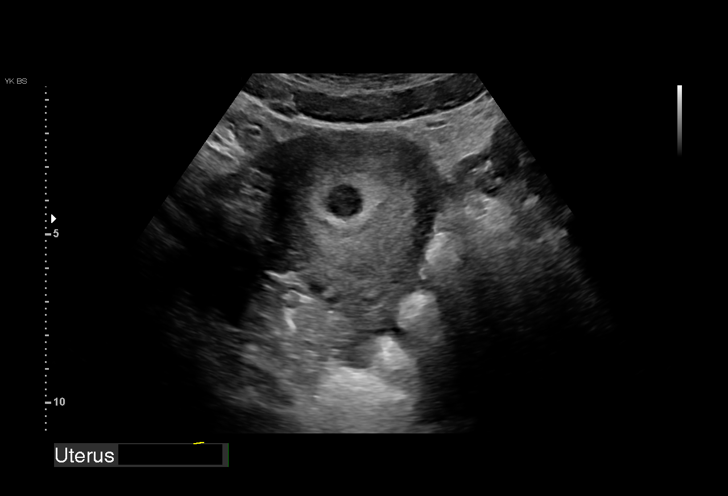
[im 13/59]
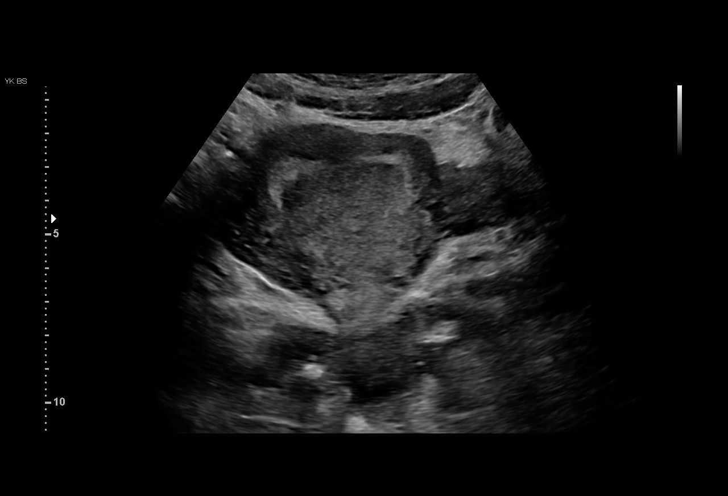
[im 18/59]
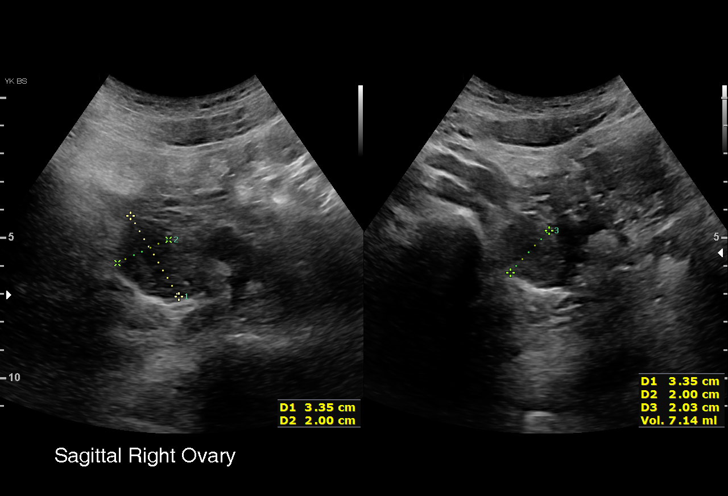
[im 22/59]
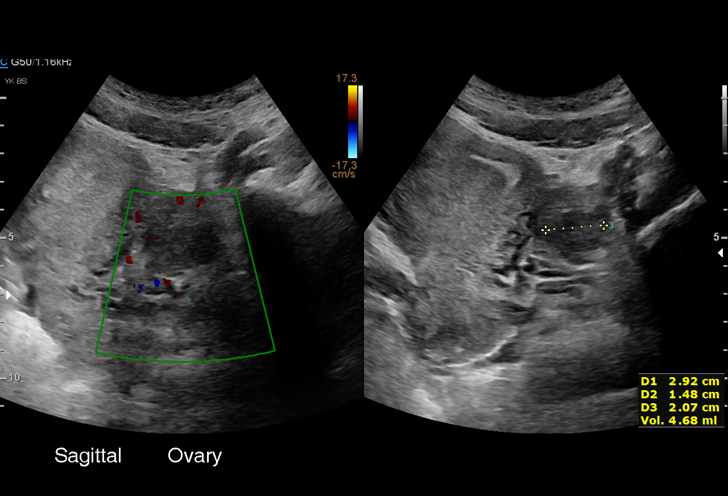
[im 26/59]
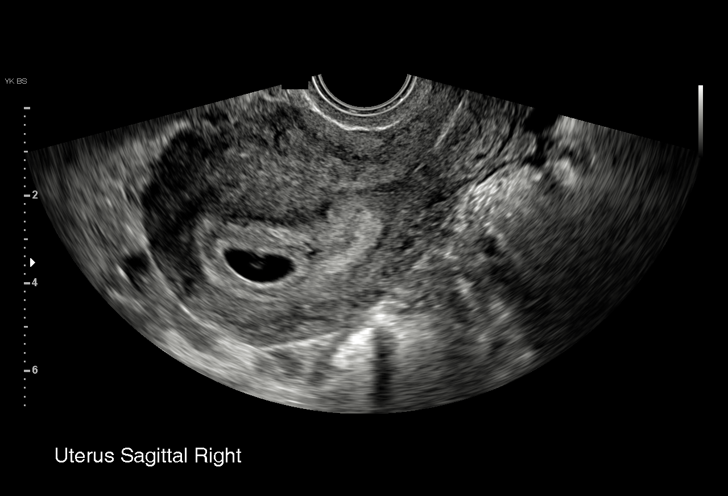
[im 31/59]
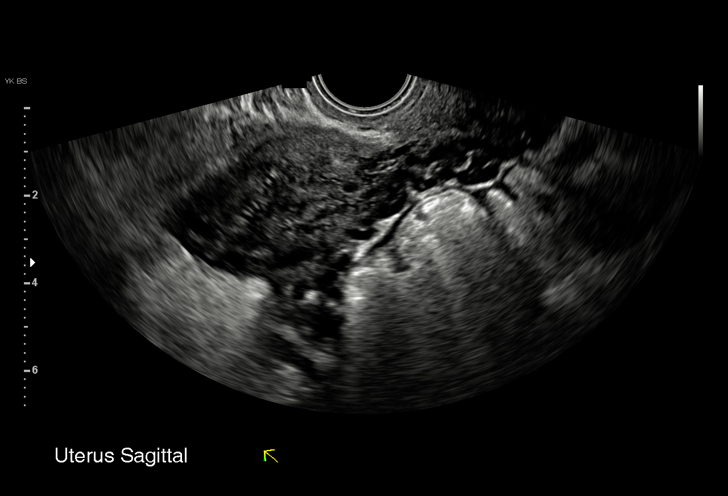
[im 33/59]
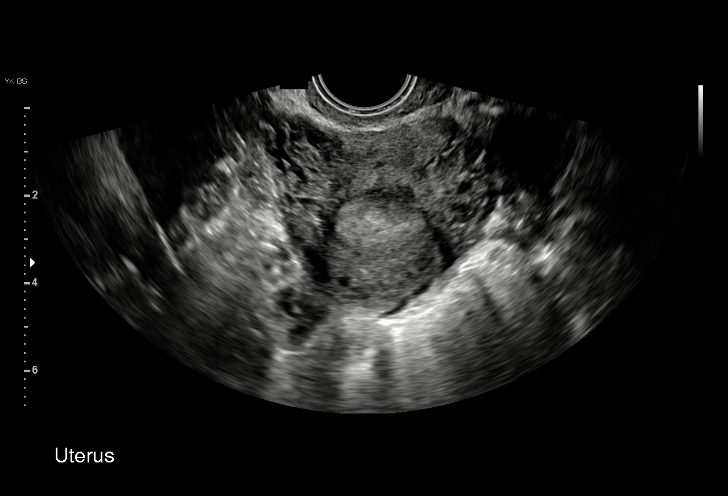
[im 37/59]
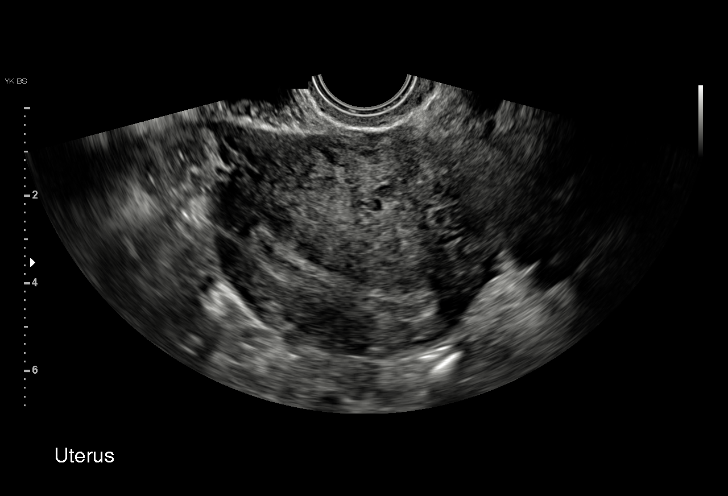
[im 41/59]
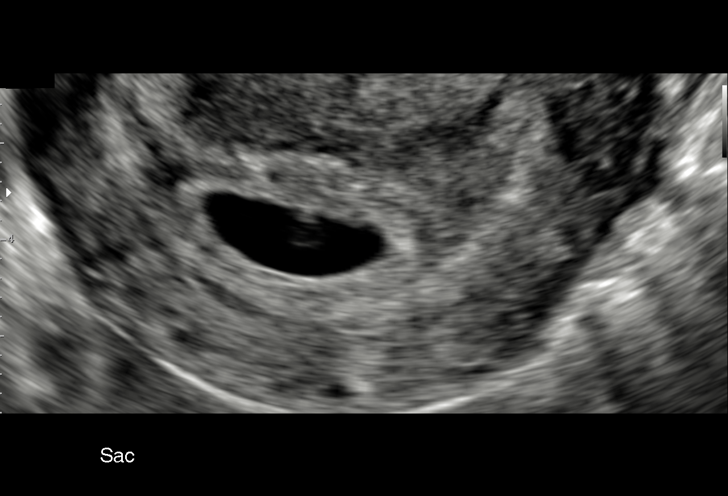
[im 46/59]
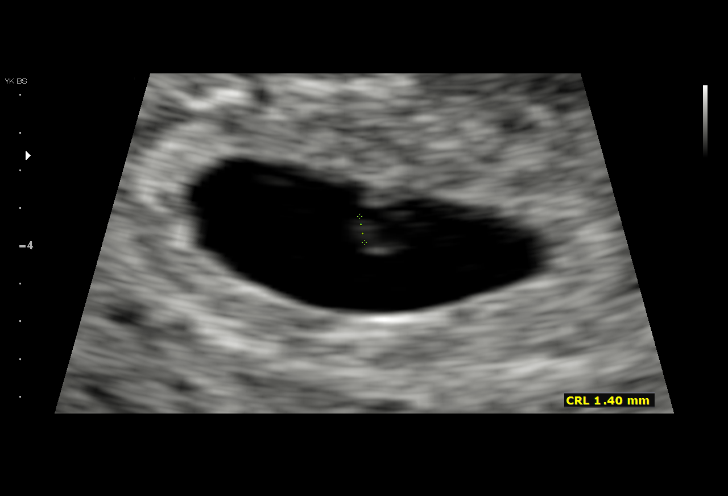
[im 50/59]
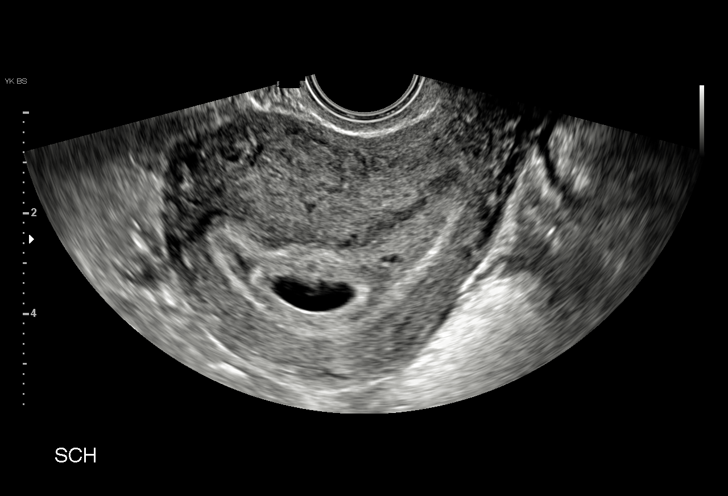
[im 54/59]
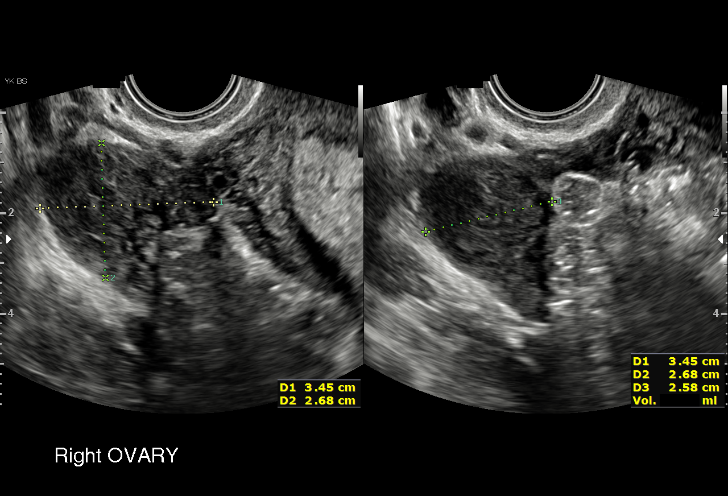
[im 59/59]
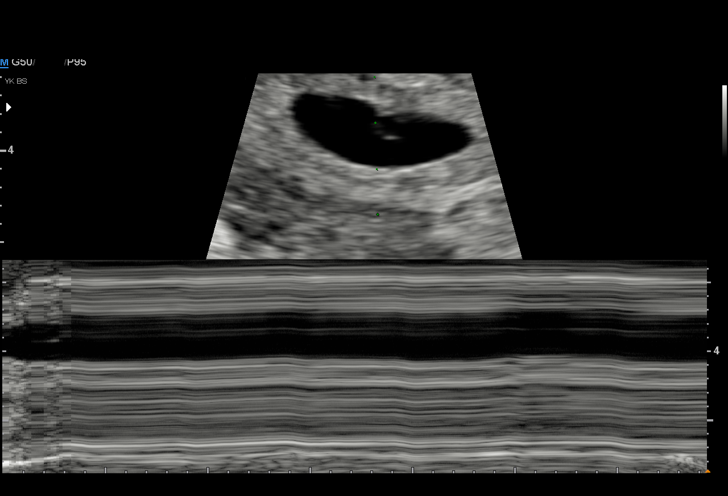

[15 of 28 positions shown; findings below may reference images not displayed]

FINDINGS: Intrauterine gestational sac: Single

Yolk sac:  Visualized.

Embryo:  Not Visualized.

Cardiac Activity: Not Visualized.

MSD: 14.6 mm   6 w   2 d

CRL: 1.39 mm. Gestational age is too early to calculate based on
crown-rump length.

Subchorionic hemorrhage:  Small

Maternal uterus/adnexae: Normal ovaries. Trace fluid in the
cul-de-sac is likely physiologic.
IMPRESSION: 1. An IUP is identified. A tiny crown-rump length is identified with
no visualization of cardiac activity. Findings are suspicious but
not yet definitive for failed pregnancy. Recommend follow-up US in
10-14 days for definitive diagnosis. This recommendation follows SRU
consensus guidelines: Diagnostic Criteria for Nonviable Pregnancy
Early in the First Trimester. N Engl J Med 0310; [DATE].

## 2020-10-02 ENCOUNTER — Other Ambulatory Visit: Payer: Medicaid Other

## 2020-10-02 ENCOUNTER — Encounter: Payer: Medicaid Other | Admitting: Advanced Practice Midwife

## 2020-10-04 ENCOUNTER — Other Ambulatory Visit: Payer: Medicaid Other

## 2020-10-04 ENCOUNTER — Other Ambulatory Visit: Payer: Self-pay

## 2020-10-04 ENCOUNTER — Ambulatory Visit (INDEPENDENT_AMBULATORY_CARE_PROVIDER_SITE_OTHER): Payer: Medicaid Other | Admitting: Nurse Practitioner

## 2020-10-04 VITALS — BP 113/65 | HR 73 | Wt 160.0 lb

## 2020-10-04 DIAGNOSIS — Z3480 Encounter for supervision of other normal pregnancy, unspecified trimester: Secondary | ICD-10-CM

## 2020-10-04 DIAGNOSIS — Z3A26 26 weeks gestation of pregnancy: Secondary | ICD-10-CM | POA: Diagnosis not present

## 2020-10-04 DIAGNOSIS — Z23 Encounter for immunization: Secondary | ICD-10-CM | POA: Diagnosis not present

## 2020-10-04 DIAGNOSIS — O219 Vomiting of pregnancy, unspecified: Secondary | ICD-10-CM

## 2020-10-04 DIAGNOSIS — G43009 Migraine without aura, not intractable, without status migrainosus: Secondary | ICD-10-CM

## 2020-10-04 NOTE — Progress Notes (Signed)
ROB 2 hr GTT today.   T-Dap: wants to talk to Np about the need of this shot    CC: vaginal pain, not taking prenatals yet. Makes her very nauseous  Wants different PNV that will not make her sick.

## 2020-10-04 NOTE — Patient Instructions (Addendum)
Look at [email or web address]  - sign up form childbirth and breastfeeding classes.  Also can take a virtual tour of the new hospital.  Kegel Exercises  Kegel exercises can help strengthen your pelvic floor muscles. The pelvic floor is a group of muscles that support your rectum, small intestine, and bladder. In females, pelvic floor muscles also help support the womb (uterus). These muscles help you control the flow of urine and stool. Kegel exercises are painless and simple, and they do not require any equipment. Your provider may suggest Kegel exercises to:  Improve bladder and bowel control.  Improve sexual response.  Improve weak pelvic floor muscles after surgery to remove the uterus (hysterectomy) or pregnancy (females).  Improve weak pelvic floor muscles after prostate gland removal or surgery (males). Kegel exercises involve squeezing your pelvic floor muscles, which are the same muscles you squeeze when you try to stop the flow of urine or keep from passing gas. The exercises can be done while sitting, standing, or lying down, but it is best to vary your position. Exercises How to do Kegel exercises: 1. Squeeze your pelvic floor muscles tight. You should feel a tight lift in your rectal area. If you are a female, you should also feel a tightness in your vaginal area. Keep your stomach, buttocks, and legs relaxed. 2. Hold the muscles tight for up to 10 seconds. 3. Breathe normally. 4. Relax your muscles. 5. Repeat as told by your health care provider. Repeat this exercise daily as told by your health care provider. Continue to do this exercise for at least 4-6 weeks, or for as long as told by your health care provider. You may be referred to a physical therapist who can help you learn more about how to do Kegel exercises. Depending on your condition, your health care provider may recommend:  Varying how long you squeeze your muscles.  Doing several sets of exercises every  day.  Doing exercises for several weeks.  Making Kegel exercises a part of your regular exercise routine. This information is not intended to replace advice given to you by your health care provider. Make sure you discuss any questions you have with your health care provider. Document Revised: 07/01/2018 Document Reviewed: 07/01/2018 Elsevier Patient Education  2020 [employer].

## 2020-10-04 NOTE — Progress Notes (Signed)
    Subjective:  Kristie Mcintosh is a 19 y.o. G2P1001 at [redacted]w[redacted]d being seen today for ongoing prenatal care.  She is currently monitored for the following issues for this low-risk pregnancy and has Tension headache; Migraine without aura and without status migrainosus, not intractable; Insomnia; Circadian rhythm sleep disorder, irregular sleep wake type; ADD (attention deficit disorder); Obesity (BMI 30-39.9); Bacterial vaginitis; Enlarged tonsils; Encounter for supervision of normal pregnancy, antepartum; and Nausea and vomiting during pregnancy on their problem list.  Patient reports incontinence.  Contractions: Regular. Vag. Bleeding: None.  Movement: Present. Denies leaking of fluid.   The following portions of the patient's history were reviewed and updated as appropriate: allergies, current medications, past family history, past medical history, past social history, past surgical history and problem list. Problem list updated.  Objective:   Vitals:   10/04/20 0846  BP: 113/65  Pulse: 73  Weight: 160 lb (72.6 kg)    Fetal Status: Fetal Heart Rate (bpm): 147 Fundal Height: 26 cm Movement: Present     General:  Alert, oriented and cooperative. Patient is in no acute distress.  Skin: Skin is warm and dry. No rash noted.   Cardiovascular: Normal heart rate noted  Respiratory: Normal respiratory effort, no problems with respiration noted  Abdomen: Soft, gravid, appropriate for gestational age. Pain/Pressure: Present     Pelvic:  Cervical exam deferred        Extremities: Normal range of motion.  Edema: None  Mental Status: Normal mood and affect. Normal behavior. Normal judgment and thought content.   Urinalysis:      Assessment and Plan:  Pregnancy: G2P1001 at [redacted]w[redacted]d  1. Supervision of other normal pregnancy, antepartum Advised to take BP weekly and record. n Reviewed BP that is too high 140/90 - either value.  Client knows to call the office with high BP. Does not use  babyscripts Advised to sign up for childbirth and breastfeeding classes Reviewed Kegal exercises for incontinence Has appointment with PT in December Counseled GH:WEXH and client agrees to get vaccine today  - Glucose Tolerance, 2 Hours w/1 Hour - CBC - RPR - HIV Antibody (routine testing w rflx)  2. Migraine without aura and without status migrainosus, not intractable Not having headaches right now  3. Nausea and vomiting during pregnancy Unable to take PNV without being sick - unable to use gummies Tried a prenatal mini sample from the office before prescribing another type Taking Zofran periodically  Preterm labor symptoms and general obstetric precautions including but not limited to vaginal bleeding, contractions, leaking of fluid and fetal movement were reviewed in detail with the patient. Please refer to After Visit Summary for other counseling recommendations.  Return in about 3 weeks (around 10/25/2020) for in person ROB.  Nolene Bernheim, RN, MSN, NP-BC Nurse Practitioner, [facility] for [employer], [facility] 10/04/2020 9:28 AM

## 2020-10-05 LAB — CBC
Hematocrit: 34.4 % (ref 34.0–46.6)
Hemoglobin: 11.5 g/dL (ref 11.1–15.9)
MCH: 29.6 pg (ref 26.6–33.0)
MCHC: 33.4 g/dL (ref 31.5–35.7)
MCV: 88 fL (ref 79–97)
Platelets: 179 10*3/uL (ref 150–450)
RBC: 3.89 x10E6/uL (ref 3.77–5.28)
RDW: 12.5 % (ref 11.7–15.4)
WBC: 11.3 10*3/uL — ABNORMAL HIGH (ref 3.4–10.8)

## 2020-10-05 LAB — RPR: RPR Ser Ql: NONREACTIVE

## 2020-10-05 LAB — HIV ANTIBODY (ROUTINE TESTING W REFLEX): HIV Screen 4th Generation wRfx: NONREACTIVE

## 2020-10-05 LAB — GLUCOSE TOLERANCE, 2 HOURS W/ 1HR
Glucose, 1 hour: 130 mg/dL (ref 65–179)
Glucose, 2 hour: 81 mg/dL (ref 65–152)
Glucose, Fasting: 78 mg/dL (ref 65–91)

## 2020-10-08 ENCOUNTER — Other Ambulatory Visit: Payer: Self-pay

## 2020-10-08 ENCOUNTER — Inpatient Hospital Stay (HOSPITAL_COMMUNITY)
Admission: AD | Admit: 2020-10-08 | Discharge: 2020-10-08 | Disposition: A | Discharge status: Home / Self Care | Payer: Medicaid Other | Attending: Obstetrics & Gynecology | Admitting: Obstetrics & Gynecology

## 2020-10-08 ENCOUNTER — Encounter (HOSPITAL_COMMUNITY): Payer: Self-pay | Admitting: Obstetrics & Gynecology

## 2020-10-08 DIAGNOSIS — O99891 Other specified diseases and conditions complicating pregnancy: Secondary | ICD-10-CM | POA: Diagnosis not present

## 2020-10-08 DIAGNOSIS — Z3A27 27 weeks gestation of pregnancy: Secondary | ICD-10-CM | POA: Insufficient documentation

## 2020-10-08 DIAGNOSIS — K219 Gastro-esophageal reflux disease without esophagitis: Secondary | ICD-10-CM | POA: Diagnosis not present

## 2020-10-08 DIAGNOSIS — O99612 Diseases of the digestive system complicating pregnancy, second trimester: Secondary | ICD-10-CM | POA: Insufficient documentation

## 2020-10-08 DIAGNOSIS — Z3689 Encounter for other specified antenatal screening: Secondary | ICD-10-CM

## 2020-10-08 DIAGNOSIS — O26892 Other specified pregnancy related conditions, second trimester: Secondary | ICD-10-CM | POA: Diagnosis not present

## 2020-10-08 DIAGNOSIS — R112 Nausea with vomiting, unspecified: Secondary | ICD-10-CM | POA: Diagnosis not present

## 2020-10-08 DIAGNOSIS — R040 Epistaxis: Secondary | ICD-10-CM | POA: Diagnosis not present

## 2020-10-08 DIAGNOSIS — O219 Vomiting of pregnancy, unspecified: Secondary | ICD-10-CM

## 2020-10-08 NOTE — Discharge Instructions (Signed)
Can use fresh carrot/unfiltered apple juice + papaya enzymes for relief of reflux symptoms. Use hypertonic saline nasal spray nightly at bedtime for relief of stuffiness and prevention of bloody nose.    Food Choices for Gastroesophageal Reflux Disease, Adult When you have gastroesophageal reflux disease (GERD), the foods you eat and your eating habits are very important. Choosing the right foods can help ease the discomfort of GERD. Consider working with a diet and nutrition specialist (dietitian) to help you make healthy food choices. What general guidelines should I follow?  Eating plan  Choose healthy foods low in fat, such as fruits, vegetables, whole grains, low-fat dairy products, and lean meat, fish, and poultry.  Eat frequent, small meals instead of three large meals each day. Eat your meals slowly, in a relaxed setting. Avoid bending over or lying down until 2-3 hours after eating.  Limit high-fat foods such as fatty meats or fried foods.  Limit your intake of oils, butter, and shortening to less than 8 teaspoons each day.  Avoid the following: ? Foods that cause symptoms. These may be different for different people. Keep a food diary to keep track of foods that cause symptoms. ? Alcohol. ? Drinking large amounts of liquid with meals. ? Eating meals during the 2-3 hours before bed.  Cook foods using methods other than frying. This may include baking, grilling, or broiling. Lifestyle  Maintain a healthy weight. Ask your health care provider what weight is healthy for you. If you need to lose weight, work with your health care provider to do so safely.  Exercise for at least 30 minutes on 5 or more days each week, or as told by your health care provider.  Avoid wearing clothes that fit tightly around your waist and chest.  Do not use any products that contain nicotine or tobacco, such as cigarettes and e-cigarettes. If you need help quitting, ask your health care  provider.  Sleep with the head of your bed raised. Use a wedge under the mattress or blocks under the bed frame to raise the head of the bed. What foods are not recommended? The items listed may not be a complete list. Talk with your dietitian about what dietary choices are best for you. Grains Pastries or quick breads with added fat. [place] toast. Vegetables Deep fried vegetables. [place] fries. Any vegetables prepared with added fat. Any vegetables that cause symptoms. For some people this may include tomatoes and tomato products, chili peppers, onions and garlic, and horseradish. Fruits Any fruits prepared with added fat. Any fruits that cause symptoms. For some people this may include citrus fruits, such as oranges, grapefruit, pineapple, and lemons. Meats and other protein foods High-fat meats, such as fatty beef or pork, hot dogs, ribs, ham, sausage, salami and bacon. Fried meat or protein, including fried fish and fried chicken. Nuts and nut butters. Dairy Whole milk and chocolate milk. Sour cream. Cream. Ice cream. Cream cheese. Milk shakes. Beverages Coffee and tea, with or without caffeine. Carbonated beverages. Sodas. Energy drinks. Fruit juice made with acidic fruits (such as orange or grapefruit). Tomato juice. Alcoholic drinks. Fats and oils Butter. Margarine. Shortening. Ghee. Sweets and desserts Chocolate and cocoa. Donuts. Seasoning and other foods Pepper. Peppermint and spearmint. Any condiments, herbs, or seasonings that cause symptoms. For some people, this may include curry, hot sauce, or vinegar-based salad dressings. Summary  When you have gastroesophageal reflux disease (GERD), food and lifestyle choices are very important to help ease the discomfort of GERD.  Eat frequent, small meals instead of three large meals each day. Eat your meals slowly, in a relaxed setting. Avoid bending over or lying down until 2-3 hours after eating.  Limit high-fat foods such as fatty  meat or fried foods. This information is not intended to replace advice given to you by your health care provider. Make sure you discuss any questions you have with your health care provider. Document Revised: 03/04/2019 Document Reviewed: 11/12/2016 Elsevier Patient Education  2020 [employer].

## 2020-10-08 NOTE — MAU Provider Note (Signed)
First Provider Initiated Contact with Patient 10/08/20 2246     S [name] is a 19 y.o. G2P1001 pregnant female at [redacted]w[redacted]d who presents to MAU today with complaint of one episode of emesis upon waking this evening, with a bloody nose. She reports that she woke up nauseated, sat down to use the bathroom, started vomiting and noted her bloody nose and some blood in the vomit. She got her TDaP vaccine at her appt this week and has felt stuffy and run down since her shot. She also has a history of n/v in pregnancy and has been noticing some increased reflux (coughing at night and mild burning in her throat at night). She is no longer nauseated or bleeding, was sitting up in the bed eating [employer] when I entered the room.    O BP 110/69 (BP Location: Right Arm)   Pulse 74   Temp 98.2 F (36.8 C) (Oral)   Resp 16   Ht 4\' 11"  (1.499 m)   Wt 160 lb 9.6 oz (72.8 kg)   LMP  (LMP Unknown)   SpO2 100%   BMI 32.44 kg/m  Physical Exam Vitals and nursing note reviewed.  Constitutional:      General: She is not in acute distress.    Appearance: Normal appearance. She is normal weight. She is not ill-appearing.  HENT:     Nose: Congestion (mild stuffiness) present. No rhinorrhea.  Eyes:     Pupils: Pupils are equal, round, and reactive to light.  Cardiovascular:     Rate and Rhythm: Normal rate.     Pulses: Normal pulses.  Pulmonary:     Effort: Pulmonary effort is normal. No respiratory distress.  Musculoskeletal:        General: Normal range of motion.     Cervical back: Normal range of motion.  Skin:    General: Skin is warm and dry.     Capillary Refill: Capillary refill takes less than 2 seconds.  Neurological:     Mental Status: She is alert and oriented to person, place, and time. Mental status is at baseline.  Psychiatric:        Mood and Affect: Mood normal.        Behavior: Behavior normal.        Thought Content: Thought content normal.        Judgment: Judgment normal.     A GERD Bloody nose Nausea & vomiting of pregnancy  P Offered reflux medication, pt declined stating she prefers not to take medications.  - discussed non-pharmacological ways to relieve GERD including remaining sitting up for at least after eating before bed, avoidance of spicy and greasy foods, and use of carrot juice and papaya enzymes for prevention. Pt expressed understanding. Discharge from MAU in stable condition with return precautions Follow up at [facility] for ongoing prenatal care Warning signs for worsening condition that would warrant emergency follow-up discussed Patient may return to MAU as needed for emergent OB/GYN related complaints  , CNM 10/08/2020 11:00 PM

## 2020-10-08 NOTE — MAU Note (Signed)
..  Kristie Mcintosh is a 19 y.o. at [redacted]w[redacted]d here in MAU reporting: vomiting blood and bloody nose. She went to the restroom and felt nauseas and started vomiting and noticed there was blood "everywhere" as it was coming out of her nose and in the vomit. After this emesis episode she has had a 6/10 headache. This is the only time she has vomited in the past 24 hours.  Reports that on 10/04/20 she got a shot at her prenatal visit and has has a stuffed nose and sore throat since then.  Denies vaginal bleeding or leaking of fluid. Not a lot of fetal movement in the past 2 hours.  Pain score: 6/10 headache Vitals:   10/08/20 2140 10/08/20 2146  BP: 110/69   Pulse: 74   Resp: 16   Temp: 98.2 F (36.8 C)   SpO2: 100% 100%     FHT: doppler 150 Lab orders placed from triage: UA

## 2020-10-18 ENCOUNTER — Encounter: Payer: Medicaid Other | Admitting: Nurse Practitioner

## 2020-10-24 ENCOUNTER — Ambulatory Visit (INDEPENDENT_AMBULATORY_CARE_PROVIDER_SITE_OTHER): Payer: Medicaid Other | Admitting: Obstetrics and Gynecology

## 2020-10-24 ENCOUNTER — Encounter: Payer: Self-pay | Admitting: Obstetrics and Gynecology

## 2020-10-24 ENCOUNTER — Other Ambulatory Visit: Payer: Self-pay

## 2020-10-24 VITALS — BP 108/62 | HR 93 | Wt 167.0 lb

## 2020-10-24 DIAGNOSIS — Z3480 Encounter for supervision of other normal pregnancy, unspecified trimester: Secondary | ICD-10-CM

## 2020-10-24 DIAGNOSIS — Z3A29 29 weeks gestation of pregnancy: Secondary | ICD-10-CM

## 2020-10-24 DIAGNOSIS — O2243 Hemorrhoids in pregnancy, third trimester: Secondary | ICD-10-CM

## 2020-10-24 MED ORDER — HYDROCORTISONE ACETATE 25 MG RE SUPP: 25.0000 mg | Freq: Two times a day (BID) | RECTAL | 0 refills | Status: DC

## 2020-10-24 NOTE — Progress Notes (Signed)
   PRENATAL VISIT NOTE  Subjective:  Kristie Mcintosh is a 19 y.o. G2P1001 at [redacted]w[redacted]d being seen today for ongoing prenatal care.  She is currently monitored for the following issues for this low-risk pregnancy and has Tension headache; Migraine without aura and without status migrainosus, not intractable; Insomnia; Circadian rhythm sleep disorder, irregular sleep wake type; ADD (attention deficit disorder); Obesity (BMI 30-39.9); Bacterial vaginitis; Enlarged tonsils; Encounter for supervision of normal pregnancy, antepartum; and Nausea and vomiting during pregnancy on their problem list.  Patient reports irritation of hemorrhoids.  Contractions: Regular. Vag. Bleeding: None.  Movement: Present. Denies leaking of fluid.   The following portions of the patient's history were reviewed and updated as appropriate: allergies, current medications, past family history, past medical history, past social history, past surgical history and problem list.   Objective:   Vitals:   10/24/20 1559  BP: 108/62  Pulse: 93  Weight: 167 lb (75.8 kg)    Fetal Status: Fetal Heart Rate (bpm): 152 Fundal Height: 29 cm Movement: Present     General:  Alert, oriented and cooperative. Patient is in no acute distress.  Skin: Skin is warm and dry. No rash noted.   Cardiovascular: Normal heart rate noted  Respiratory: Normal respiratory effort, no problems with respiration noted  Abdomen: Soft, gravid, appropriate for gestational age.  Pain/Pressure: Present     Pelvic: Cervical exam deferred        Extremities: Normal range of motion.  Edema: None  Mental Status: Normal mood and affect. Normal behavior. Normal judgment and thought content.   Assessment and Plan:  Pregnancy: G2P1001 at [redacted]w[redacted]d 1. Supervision of other normal pregnancy, antepartum   2. [redacted] weeks gestation of pregnancy   3. Hemorrhoids during pregnancy, antepartum, third trimester - Diet modifications discussed.   - Patient to increase water  intake.   - hydrocortisone (ANUSOL-HC) 25 MG suppository; Place 1 suppository (25 mg total) rectally 2 (two) times daily.  Dispense: 12 suppository; Refill: 0  Preterm labor symptoms and general obstetric precautions including but not limited to vaginal bleeding, contractions, leaking of fluid and fetal movement were reviewed in detail with the patient. Please refer to After Visit Summary for other counseling recommendations.   Return in about 2 weeks (around 11/07/2020) for LROB.  Future Appointments  Date Time Provider Department Center  10/31/2020  8:30 AM Theressa Millard, PT [facility]  11/14/2020  1:00 PM Theressa Millard, PT [facility]  11/21/2020  1:00 PM Theressa Millard, PT [facility]  11/28/2020  1:00 PM Theressa Millard, PT [facility]    Johnny Bridge, MD

## 2020-10-24 NOTE — Progress Notes (Signed)
Pt presents for ROB c/o possible ruptured hemorrhoid.

## 2020-10-24 NOTE — Patient Instructions (Signed)
Hemorrhoids Hemorrhoids are swollen veins that may develop:  In the butt (rectum). These are called internal hemorrhoids.  Around the opening of the butt (anus). These are called external hemorrhoids. Hemorrhoids can cause pain, itching, or bleeding. Most of the time, they do not cause serious problems. They usually get better with diet changes, lifestyle changes, and other home treatments. What are the causes? This condition may be caused by:  Having trouble pooping (constipation).  Pushing hard (straining) to poop.  Watery poop (diarrhea).  Pregnancy.  Being very overweight (obese).  Sitting for long periods of time.  Heavy lifting or other activity that causes you to strain.  Anal sex.  Riding a bike for a long period of time. What are the signs or symptoms? Symptoms of this condition include:  Pain.  Itching or soreness in the butt.  Bleeding from the butt.  Leaking poop.  Swelling in the area.  One or more lumps around the opening of your butt. How is this diagnosed? A doctor can often diagnose this condition by looking at the affected area. The doctor may also:  Do an exam that involves feeling the area with a gloved hand (digital rectal exam).  Examine the area inside your butt using a small tube (anoscope).  Order blood tests. This may be done if you have lost a lot of blood.  Have you get a test that involves looking inside the colon using a flexible tube with a camera on the end (sigmoidoscopy or colonoscopy). How is this treated? This condition can usually be treated at home. Your doctor may tell you to change what you eat, make lifestyle changes, or try home treatments. If these do not help, procedures can be done to remove the hemorrhoids or make them smaller. These may involve:  Placing rubber bands at the base of the hemorrhoids to cut off their blood supply.  Injecting medicine into the hemorrhoids to shrink them.  Shining a type of light  energy onto the hemorrhoids to cause them to fall off.  Doing surgery to remove the hemorrhoids or cut off their blood supply. Follow these instructions at home: Eating and drinking   Eat foods that have a lot of fiber in them. These include whole grains, beans, nuts, fruits, and vegetables.  Ask your doctor about taking products that have added fiber (fibersupplements).  Reduce the amount of fat in your diet. You can do this by: ? Eating low-fat dairy products. ? Eating less red meat. ? Avoiding processed foods.  Drink enough fluid to keep your pee (urine) pale yellow. Managing pain and swelling   Take a warm-water bath (sitz bath) for 20 minutes to ease pain. Do this 3-4 times a day. You may do this in a bathtub or using a portable sitz bath that fits over the toilet.  If told, put ice on the painful area. It may be helpful to use ice between your warm baths. ? Put ice in a plastic bag. ? Place a towel between your skin and the bag. ? Leave the ice on for 20 minutes, 2-3 times a day. General instructions  Take over-the-counter and prescription medicines only as told by your doctor. ? Medicated creams and medicines may be used as told.  Exercise often. Ask your doctor how much and what kind of exercise is best for you.  Go to the bathroom when you have the urge to poop. Do not wait.  Avoid pushing too hard when you poop.  Keep your   butt dry and clean. Use wet toilet paper or moist towelettes after pooping.  Do not sit on the toilet for a long time.  Keep all follow-up visits as told by your doctor. This is important. Contact a doctor if you:  Have pain and swelling that do not get better with treatment or medicine.  Have trouble pooping.  Cannot poop.  Have pain or swelling outside the area of the hemorrhoids. Get help right away if you have:  Bleeding that will not stop. Summary  Hemorrhoids are swollen veins in the butt or around the opening of the  butt.  They can cause pain, itching, or bleeding.  Eat foods that have a lot of fiber in them. These include whole grains, beans, nuts, fruits, and vegetables.  Take a warm-water bath (sitz bath) for 20 minutes to ease pain. Do this 3-4 times a day. This information is not intended to replace advice given to you by your health care provider. Make sure you discuss any questions you have with your health care provider. Document Revised: 11/19/2018 Document Reviewed: 04/02/2018 Elsevier Patient Education  2020 [employer] Inc[employer]  

## 2020-10-31 ENCOUNTER — Encounter: Payer: Medicaid Other | Attending: Advanced Practice Midwife | Admitting: Physical Therapy

## 2020-11-07 ENCOUNTER — Encounter: Payer: Medicaid Other | Admitting: Obstetrics and Gynecology

## 2020-11-07 ENCOUNTER — Encounter: Payer: Medicaid Other | Admitting: Physical Therapy

## 2020-11-09 ENCOUNTER — Ambulatory Visit (INDEPENDENT_AMBULATORY_CARE_PROVIDER_SITE_OTHER): Payer: Medicaid Other

## 2020-11-09 ENCOUNTER — Other Ambulatory Visit: Payer: Self-pay

## 2020-11-09 VITALS — BP 125/80 | HR 103 | Wt 166.0 lb

## 2020-11-09 DIAGNOSIS — Z3A31 31 weeks gestation of pregnancy: Secondary | ICD-10-CM

## 2020-11-09 DIAGNOSIS — Z3480 Encounter for supervision of other normal pregnancy, unspecified trimester: Secondary | ICD-10-CM

## 2020-11-09 NOTE — Progress Notes (Signed)
   LOW-RISK PREGNANCY OFFICE VISIT  Patient name: Kristie Mcintosh MRN [id number]  Date of birth: [birth year] Chief Complaint:   Routine Prenatal Visit  Subjective:   Kristie Mcintosh is a 19 y.o. G69P1001 female at [redacted]w[redacted]d with an Estimated Date of Delivery: 01/07/21 being seen today for ongoing management of a low-risk pregnancy aeb has Tension headache; Migraine without aura and without status migrainosus, not intractable; Insomnia; Circadian rhythm sleep disorder, irregular sleep wake type; ADD (attention deficit disorder); Obesity (BMI 30-39.9); Bacterial vaginitis; Enlarged tonsils; Encounter for supervision of normal pregnancy, antepartum; and Nausea and vomiting during pregnancy on their problem list.  Patient presents today with complaint of pelvic pain and pressure. Patient states she tried on maternity belt, but it hurt her back.  Patient endorses fetal movement and denies abdominal cramping or contractions.  Patient denies vaginal concerns including abnormal discharge, leaking of fluid, and bleeding.  Contractions: Irritability. Vag. Bleeding: None.  Movement: Present.  Reviewed past medical,surgical, social, obstetrical and family history as well as problem list, medications and allergies.  Objective   Vitals:   11/09/20 1429  BP: 125/80  Pulse: (!) 103  Weight: 166 lb (75.3 kg)  Body mass index is 33.53 kg/m.  Total Weight Gain:12 lb (5.443 kg)         Physical Examination:   General appearance: Well appearing, and in no distress  Mental status: Alert, oriented to person, place, and time  Skin: Warm & dry  Cardiovascular: Normal heart rate noted  Respiratory: Normal respiratory effort, no distress  Abdomen: Soft, gravid, nontender, AGA with Fundal height of Fundal Height: 31 cm  Pelvic: Cervical exam deferred           Extremities: Edema: Trace  Fetal Status: Fetal Heart Rate (bpm): 143  Movement: Present   No results found for this or any previous visit (from the past 24  hour(s)).  Assessment & Plan:  Low-risk pregnancy of a 20 y.o., G2P1001 at [redacted]w[redacted]d with an Estimated Date of Delivery: 01/07/21   1. Supervision of other normal pregnancy, antepartum -Anticipatory guidance for upcoming appts. -Discussed labor preparations. -Reviewed contraction characteristics and when to contact office for q/c.  -Encouraged to not worry about labor.  Informed that toddler son would not be allowed into hospital.   2. [redacted] weeks gestation of pregnancy -Discussed c/o pelvic pain. -Encouraged usage of pregnancy belt/band while working. -Patient to discuss reducing standing hours at work.  -Instructed to contact office for work restriction note if necessary.    Meds: No orders of the defined types were placed in this encounter.  Labs/procedures today:  Lab Orders  No laboratory test(s) ordered today     Reviewed: Preterm labor symptoms and general obstetric precautions including but not limited to vaginal bleeding, contractions, leaking of fluid and fetal movement were reviewed in detail with the patient.  All questions were answered.  Follow-up: Return in about 3 weeks (around 11/30/2020) for LROB.  No orders of the defined types were placed in this encounter.  Cherre Robins MSN, CNM 11/09/2020

## 2020-11-09 NOTE — Progress Notes (Signed)
ROB  CC: Pelvic pain pt states it's becoming hard to preform job task.

## 2020-11-09 NOTE — Patient Instructions (Signed)
Abdominal Pain During Pregnancy  Abdominal pain is common during pregnancy, and has many possible causes. Some causes are more serious than others, and sometimes the cause is not known. Abdominal pain can be a sign that labor is starting. It can also be caused by normal growth and stretching of muscles and ligaments during pregnancy. Always tell your health care provider if you have any abdominal pain. Follow these instructions at home:  Do not have sex or put anything in your vagina until your pain goes away completely.  Get plenty of rest until your pain improves.  Drink enough fluid to keep your urine pale yellow.  Take over-the-counter and prescription medicines only as told by your health care provider.  Keep all follow-up visits as told by your health care provider. This is important. Contact a health care provider if:  Your pain continues or gets worse after resting.  You have lower abdominal pain that: ? Comes and goes at regular intervals. ? Spreads to your back. ? Is similar to menstrual cramps.  You have pain or burning when you urinate. Get help right away if:  You have a fever or chills.  You have vaginal bleeding.  You are leaking fluid from your vagina.  You are passing tissue from your vagina.  You have vomiting or diarrhea that lasts for more than 24 hours.  Your baby is moving less than usual.  You feel very weak or faint.  You have shortness of breath.  You develop severe pain in your upper abdomen. Summary  Abdominal pain is common during pregnancy, and has many possible causes.  If you experience abdominal pain during pregnancy, tell your health care provider right away.  Follow your health care provider's home care instructions and keep all follow-up visits as directed. This information is not intended to replace advice given to you by your health care provider. Make sure you discuss any questions you have with your health care  provider. Document Revised: 03/01/2019 Document Reviewed: 02/13/2017 Elsevier Patient Education  2020 [employer] Inc[employer]  

## 2020-11-14 ENCOUNTER — Encounter: Payer: Medicaid Other | Admitting: Physical Therapy

## 2020-11-20 ENCOUNTER — Other Ambulatory Visit: Payer: Self-pay

## 2020-11-20 ENCOUNTER — Inpatient Hospital Stay (HOSPITAL_BASED_OUTPATIENT_CLINIC_OR_DEPARTMENT_OTHER): Payer: Medicaid Other

## 2020-11-20 ENCOUNTER — Encounter (HOSPITAL_COMMUNITY): Payer: Self-pay | Admitting: Family Medicine

## 2020-11-20 ENCOUNTER — Inpatient Hospital Stay (HOSPITAL_COMMUNITY)
Admission: AD | Admit: 2020-11-20 | Discharge: 2020-11-20 | Disposition: A | Discharge status: Home / Self Care | Payer: Medicaid Other | Attending: Family Medicine | Admitting: Family Medicine

## 2020-11-20 DIAGNOSIS — T1490XA Injury, unspecified, initial encounter: Secondary | ICD-10-CM | POA: Insufficient documentation

## 2020-11-20 DIAGNOSIS — Y929 Unspecified place or not applicable: Secondary | ICD-10-CM | POA: Diagnosis not present

## 2020-11-20 DIAGNOSIS — R110 Nausea: Secondary | ICD-10-CM | POA: Insufficient documentation

## 2020-11-20 DIAGNOSIS — Z3A33 33 weeks gestation of pregnancy: Secondary | ICD-10-CM

## 2020-11-20 DIAGNOSIS — Z3689 Encounter for other specified antenatal screening: Secondary | ICD-10-CM | POA: Diagnosis not present

## 2020-11-20 DIAGNOSIS — Z369 Encounter for antenatal screening, unspecified: Secondary | ICD-10-CM

## 2020-11-20 DIAGNOSIS — O99891 Other specified diseases and conditions complicating pregnancy: Secondary | ICD-10-CM

## 2020-11-20 DIAGNOSIS — O9A313 Physical abuse complicating pregnancy, third trimester: Secondary | ICD-10-CM

## 2020-11-20 DIAGNOSIS — O26893 Other specified pregnancy related conditions, third trimester: Secondary | ICD-10-CM | POA: Diagnosis not present

## 2020-11-20 DIAGNOSIS — R109 Unspecified abdominal pain: Secondary | ICD-10-CM | POA: Diagnosis not present

## 2020-11-20 DIAGNOSIS — R1084 Generalized abdominal pain: Secondary | ICD-10-CM | POA: Insufficient documentation

## 2020-11-20 DIAGNOSIS — Y939 Activity, unspecified: Secondary | ICD-10-CM | POA: Diagnosis not present

## 2020-11-20 DIAGNOSIS — Z87891 Personal history of nicotine dependence: Secondary | ICD-10-CM | POA: Diagnosis not present

## 2020-11-20 DIAGNOSIS — O9A213 Injury, poisoning and certain other consequences of external causes complicating pregnancy, third trimester: Secondary | ICD-10-CM | POA: Diagnosis not present

## 2020-11-20 DIAGNOSIS — O9A219 Injury, poisoning and certain other consequences of external causes complicating pregnancy, unspecified trimester: Secondary | ICD-10-CM

## 2020-11-20 LAB — POCT FERN TEST: POCT Fern Test: NEGATIVE

## 2020-11-20 MED ORDER — ACETAMINOPHEN 500 MG PO TABS
1000.0000 mg | ORAL_TABLET | Freq: Four times a day (QID) | ORAL | Status: DC | PRN
  Administered 2020-11-20: 1000 mg via ORAL
  Filled 2020-11-20: qty 2

## 2020-11-20 MED ORDER — CYCLOBENZAPRINE HCL 5 MG PO TABS: 10.0000 mg | ORAL_TABLET | Freq: Three times a day (TID) | ORAL | Status: DC | PRN

## 2020-11-20 MED ORDER — OXYCODONE-ACETAMINOPHEN 5-325 MG PO TABS: 1.0000 | ORAL_TABLET | Freq: Once | ORAL | Status: DC
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
## 2020-11-20 NOTE — Discharge Instructions (Signed)
Abdominal Pain During Pregnancy  Belly (abdominal) pain is common during pregnancy. There are many possible causes. Most of the time, it is not a serious problem. Other times, it can be a sign that something is wrong with the pregnancy. Always tell your doctor if you have belly pain. Follow these instructions at home:  Do not have sex or put anything in your vagina until your pain goes away completely.  Get plenty of rest until your pain gets better.  Drink enough fluid to keep your pee (urine) pale yellow.  Take over-the-counter and prescription medicines only as told by your doctor.  Keep all follow-up visits as told by your doctor. This is important. Contact a doctor if:  Your pain continues or gets worse after resting.  You have lower belly pain that: ? Comes and goes at regular times. ? Spreads to your back. ? Feels like menstrual cramps.  You have pain or burning when you pee (urinate). Get help right away if:  You have a fever or chills.  You have vaginal bleeding.  You are leaking fluid from your vagina.  You are passing tissue from your vagina.  You throw up (vomit) for more than 24 hours.  You have watery poop (diarrhea) for more than 24 hours.  Your baby is moving less than usual.  You feel very weak or faint.  You have shortness of breath.  You have very bad pain in your upper belly. Summary  Belly (abdominal) pain is common during pregnancy. There are many possible causes.  If you have belly pain during pregnancy, tell your doctor right away.  Keep all follow-up visits as told by your doctor. This is important. This information is not intended to replace advice given to you by your health care provider. Make sure you discuss any questions you have with your health care provider. Document Revised: 03/01/2019 Document Reviewed: 02/13/2017 Elsevier Patient Education  2020 Elsevier Inc.  
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
## 2020-11-21 ENCOUNTER — Encounter: Payer: Medicaid Other | Admitting: Physical Therapy

## 2020-11-25 NOTE — L&D Delivery Note (Signed)
Delivery Note Asked by RN in MAU to escort patient upstairs to L&D given patient feeling increased pressure and urge to push. Upon arrival to her room on L&D patient found to be completely dilated with bulging bag. Given that patient could not receive an epidural or IV pain medication decision was made at that time to AROM patient as she was thrashing around in the bed in discomfort. She did not receive GBS prophylaxis due to spontaneous delivery. At 1:56 AM a viable female was delivered via Vaginal, Spontaneous (Presentation: Left Occiput Anterior).  Loose nuchal x2, delivered through. APGAR: 8, 9; weight pending.   Placenta status: Spontaneous, Intact.  Cord: 3 vessels with the following complications: None.   Anesthesia: None Episiotomy: None Lacerations: None Est. Blood Loss (mL): 50  Mom to postpartum.  Baby to Couplet care / Skin to Skin.  Alric Seton 12/26/2020, 2:19 AM

## 2020-11-28 ENCOUNTER — Encounter: Payer: Medicaid Other | Admitting: Physical Therapy

## 2020-11-30 ENCOUNTER — Encounter: Payer: Medicaid Other | Admitting: Obstetrics and Gynecology

## 2020-12-01 ENCOUNTER — Emergency Department (HOSPITAL_COMMUNITY)
Admission: EM | Admit: 2020-12-01 | Discharge: 2020-12-02 | Disposition: A | Discharge status: Home / Self Care | Payer: Medicaid Other

## 2020-12-01 ENCOUNTER — Encounter: Payer: Medicaid Other | Admitting: Obstetrics

## 2020-12-01 NOTE — ED Notes (Signed)
When this RN began triaging pt, pt asked how long wait was. This RN advised that longest wait currently 14hrs, that her wait could be more or less depending on availability of beds. Pt stated she did not want to wait that long & asked for advice on what to do. This RN told pt that advice could not be given, that the decision was her own to make. Pt choosing to leave, advised that she could return if she felt the need to be seen.

## 2020-12-04 ENCOUNTER — Other Ambulatory Visit: Payer: Self-pay

## 2020-12-04 ENCOUNTER — Ambulatory Visit (INDEPENDENT_AMBULATORY_CARE_PROVIDER_SITE_OTHER): Payer: Medicaid Other | Admitting: Obstetrics

## 2020-12-04 ENCOUNTER — Encounter: Payer: Self-pay | Admitting: Obstetrics

## 2020-12-04 VITALS — BP 111/71 | HR 82 | Wt 167.5 lb

## 2020-12-04 DIAGNOSIS — Z3480 Encounter for supervision of other normal pregnancy, unspecified trimester: Secondary | ICD-10-CM

## 2020-12-04 NOTE — Progress Notes (Signed)
Subjective:  Kristie Mcintosh is a 20 y.o. G2P1001 at [redacted]w[redacted]d being seen today for ongoing prenatal care.  She is currently monitored for the following issues for this low-risk pregnancy and has Tension headache; Migraine without aura and without status migrainosus, not intractable; Insomnia; Circadian rhythm sleep disorder, irregular sleep wake type; ADD (attention deficit disorder); Obesity (BMI 30-39.9); Bacterial vaginitis; Enlarged tonsils; Encounter for supervision of normal pregnancy, antepartum; and Nausea and vomiting during pregnancy on their problem list.  Patient reports backache and heartburn.  Contractions: Irregular. Vag. Bleeding: None.  Movement: Present. Denies leaking of fluid.   The following portions of the patient's history were reviewed and updated as appropriate: allergies, current medications, past family history, past medical history, past social history, past surgical history and problem list. Problem list updated.  Objective:   Vitals:   12/04/20 0859  BP: 111/71  Pulse: 82  Weight: 167 lb 8 oz (76 kg)    Fetal Status:     Movement: Present     General:  Alert, oriented and cooperative. Patient is in no acute distress.  Skin: Skin is warm and dry. No rash noted.   Cardiovascular: Normal heart rate noted  Respiratory: Normal respiratory effort, no problems with respiration noted  Abdomen: Soft, gravid, appropriate for gestational age. Pain/Pressure: Present     Pelvic:  Cervical exam deferred        Extremities: Normal range of motion.  Edema: Trace  Mental Status: Normal mood and affect. Normal behavior. Normal judgment and thought content.   Urinalysis:      Assessment and Plan:  Pregnancy: G2P1001 at [redacted]w[redacted]d  1. Supervision of other normal pregnancy, antepartum   Preterm labor symptoms and general obstetric precautions including but not limited to vaginal bleeding, contractions, leaking of fluid and fetal movement were reviewed in detail with the  patient. Please refer to After Visit Summary for other counseling recommendations.   Return in about 1 week (around 12/11/2020) for ROB.   Brock Bad, MD  12/04/20

## 2020-12-04 NOTE — Progress Notes (Signed)
Pt reports fetal movement with irregular contractions. 

## 2020-12-11 ENCOUNTER — Encounter: Payer: Medicaid Other | Admitting: Advanced Practice Midwife

## 2020-12-12 ENCOUNTER — Other Ambulatory Visit (HOSPITAL_COMMUNITY)
Admission: RE | Admit: 2020-12-12 | Discharge: 2020-12-12 | Disposition: A | Discharge status: Home / Self Care | Payer: Medicaid Other | Source: Ambulatory Visit | Attending: Nurse Practitioner | Admitting: Nurse Practitioner

## 2020-12-12 ENCOUNTER — Ambulatory Visit (INDEPENDENT_AMBULATORY_CARE_PROVIDER_SITE_OTHER): Payer: Medicaid Other | Admitting: Nurse Practitioner

## 2020-12-12 ENCOUNTER — Other Ambulatory Visit: Payer: Self-pay

## 2020-12-12 VITALS — BP 103/68 | HR 72 | Wt 174.0 lb

## 2020-12-12 DIAGNOSIS — Z3A36 36 weeks gestation of pregnancy: Secondary | ICD-10-CM

## 2020-12-12 DIAGNOSIS — Z3480 Encounter for supervision of other normal pregnancy, unspecified trimester: Secondary | ICD-10-CM | POA: Insufficient documentation

## 2020-12-12 NOTE — Progress Notes (Signed)
    Subjective:  Kristie Mcintosh is a 20 y.o. G2P1001 at [redacted]w[redacted]d being seen today for ongoing prenatal care.  She is currently monitored for the following issues for this low-risk pregnancy and has Tension headache; Migraine without aura and without status migrainosus, not intractable; Insomnia; Circadian rhythm sleep disorder, irregular sleep wake type; ADD (attention deficit disorder); Obesity (BMI 30-39.9); Bacterial vaginitis; Enlarged tonsils; Encounter for supervision of normal pregnancy, antepartum; and Nausea and vomiting during pregnancy on their problem list.  Patient reports pelvic pain.  Contractions: Irregular. Vag. Bleeding: None.  Movement: Present. Denies leaking of fluid.   The following portions of the patient's history were reviewed and updated as appropriate: allergies, current medications, past family history, past medical history, past social history, past surgical history and problem list. Problem list updated.  Objective:   Vitals:   12/12/20 1613  BP: 103/68  Pulse: 72  Weight: 174 lb (78.9 kg)    Fetal Status: Fetal Heart Rate (bpm): 141 Fundal Height: 37 cm Movement: Present  Presentation: Undeterminable  General:  Alert, oriented and cooperative. Patient is in no acute distress.  Skin: Skin is warm and dry. No rash noted.   Cardiovascular: Normal heart rate noted  Respiratory: Normal respiratory effort, no problems with respiration noted  Abdomen: Soft, gravid, appropriate for gestational age. Pain/Pressure: Present     Pelvic:  Cervical exam performed Dilation: Closed   Station: Ballotable Vaginal swabs done  Extremities: Normal range of motion.  Edema: Trace  Mental Status: Normal mood and affect. Normal behavior. Normal judgment and thought content.   Urinalysis:      Assessment and Plan:  Pregnancy: G2P1001 at [redacted]w[redacted]d  1. Supervision of other normal pregnancy, antepartum Wants to have the baby - not time to do a sweep of the membranes - discussed with  client Wants epidural for labor Wants Nexplanon in the hospital Does not want to breast feed - going back to work and makes the baby "clingy"  - Strep Gp B NAA - Cervicovaginal ancillary only( )    Preterm labor symptoms and general obstetric precautions including but not limited to vaginal bleeding, contractions, leaking of fluid and fetal movement were reviewed in detail with the patient. Please refer to After Visit Summary for other counseling recommendations.  Return in about 1 week (around 12/19/2020).  Nolene Bernheim, RN, MSN, NP-BC Nurse Practitioner, [facility] for [employer], [facility] 12/12/2020 4:35 PM

## 2020-12-12 NOTE — Progress Notes (Signed)
ROB/GBS c/o pain/pressure.  Patient wants her membranes sweep today.

## 2020-12-14 LAB — CERVICOVAGINAL ANCILLARY ONLY
Chlamydia: NEGATIVE
Comment: NEGATIVE
Comment: NORMAL
Neisseria Gonorrhea: NEGATIVE

## 2020-12-14 LAB — STREP GP B NAA: Strep Gp B NAA: POSITIVE — AB

## 2020-12-16 ENCOUNTER — Encounter: Payer: Self-pay | Admitting: Nurse Practitioner

## 2020-12-16 DIAGNOSIS — O99820 Streptococcus B carrier state complicating pregnancy: Secondary | ICD-10-CM | POA: Insufficient documentation

## 2020-12-19 ENCOUNTER — Other Ambulatory Visit: Payer: Self-pay

## 2020-12-19 ENCOUNTER — Ambulatory Visit (INDEPENDENT_AMBULATORY_CARE_PROVIDER_SITE_OTHER): Payer: Medicaid Other | Admitting: Advanced Practice Midwife

## 2020-12-19 VITALS — BP 117/76 | HR 73 | Wt 181.0 lb

## 2020-12-19 DIAGNOSIS — Z3A37 37 weeks gestation of pregnancy: Secondary | ICD-10-CM

## 2020-12-19 DIAGNOSIS — Z3480 Encounter for supervision of other normal pregnancy, unspecified trimester: Secondary | ICD-10-CM

## 2020-12-19 NOTE — Patient Instructions (Signed)
Things to Try After 37 weeks to Encourage Labor/Get Ready for Labor:   1.  Try the Miles Circuit at www.milescircuit.com daily to improve baby's position and encourage the onset of labor.  2. Walk a little and rest a little every day.  Change positions often.  3. Cervical Ripening: May try one or both a. Red Raspberry Leaf capsules or tea:  two 300mg or 400mg tablets with each meal, 2-3 times a day, or 1-3 cups of tea daily  Potential Side Effects Of Raspberry Leaf:  Most women do not experience any side effects from drinking raspberry leaf tea. However, nausea and loose stools are possible   b. Evening Primrose Oil capsules: take 1 capsule by mouth and place one capsule in the vagina every night.    Some of the potential side effects:  Upset stomach  Loose stools or diarrhea  Headaches  Nausea  4. Sex can also help the cervix ripen and encourage labor onset.    Labor Precautions Reasons to come to MAU at [facility]:  1.  Contractions are  5 minutes apart or less, each last 1 minute, these have been going on for 1-2 hours, and you cannot walk or talk during them 2.  You have a large gush of fluid, or a trickle of fluid that will not stop and you have to wear a pad 3.  You have bleeding that is bright red, heavier than spotting--like menstrual bleeding (spotting can be normal in early labor or after a check of your cervix) 4.  You do not feel the baby moving like he/she normally does 

## 2020-12-19 NOTE — Progress Notes (Signed)
ROB 37 wks   GBS +  CC: Patient believes to have lost mucous plug this morning  Wants cervix check.

## 2020-12-19 NOTE — Progress Notes (Signed)
   PRENATAL VISIT NOTE  Subjective:  Kristie Mcintosh is a 20 y.o. G2P1001 at [redacted]w[redacted]d being seen today for ongoing prenatal care.  She is currently monitored for the following issues for this low-risk pregnancy and has Tension headache; Migraine without aura and without status migrainosus, not intractable; Insomnia; Circadian rhythm sleep disorder, irregular sleep wake type; ADD (attention deficit disorder); Obesity (BMI 30-39.9); Bacterial vaginitis; Enlarged tonsils; Encounter for supervision of normal pregnancy, antepartum; Nausea and vomiting during pregnancy; and Group B Streptococcus carrier, +RV culture, currently pregnant on their problem list.  Patient reports occasional contractions.  Contractions: Irritability. Vag. Bleeding: None.  Movement: Present. Denies leaking of fluid.   The following portions of the patient's history were reviewed and updated as appropriate: allergies, current medications, past family history, past medical history, past social history, past surgical history and problem list.   Objective:   Vitals:   12/19/20 1535  BP: 117/76  Pulse: 73  Weight: 181 lb (82.1 kg)    Fetal Status: Fetal Heart Rate (bpm): 148 Fundal Height: 37 cm Movement: Present  Presentation: Vertex  General:  Alert, oriented and cooperative. Patient is in no acute distress.  Skin: Skin is warm and dry. No rash noted.   Cardiovascular: Normal heart rate noted  Respiratory: Normal respiratory effort, no problems with respiration noted  Abdomen: Soft, gravid, appropriate for gestational age.  Pain/Pressure: Present     Pelvic: Cervical exam performed in the presence of a chaperone Dilation: 1.5 Effacement (%): 50 Station: -2  Extremities: Normal range of motion.  Edema: Trace  Mental Status: Normal mood and affect. Normal behavior. Normal judgment and thought content.   Assessment and Plan:  Pregnancy: G2P1001 at [redacted]w[redacted]d 1. Supervision of other normal pregnancy, antepartum --Anticipatory  guidance about next visits/weeks of pregnancy given. --Next visit in 1 week --Labor readiness/labor precautions reviewed  2. [redacted] weeks gestation of pregnancy   Term labor symptoms and general obstetric precautions including but not limited to vaginal bleeding, contractions, leaking of fluid and fetal movement were reviewed in detail with the patient. Please refer to After Visit Summary for other counseling recommendations.   Return in about 1 week (around 12/26/2020).  Future Appointments  Date Time Provider Department Center  12/26/2020  3:00 PM Calvert Cantor, CNM [facility] None    Sharen Counter, [place]

## 2020-12-24 ENCOUNTER — Inpatient Hospital Stay (HOSPITAL_COMMUNITY)
Admission: AD | Admit: 2020-12-24 | Discharge: 2020-12-24 | Disposition: A | Discharge status: Home / Self Care | Payer: Medicaid Other | Attending: Obstetrics & Gynecology | Admitting: Obstetrics & Gynecology

## 2020-12-24 ENCOUNTER — Encounter (HOSPITAL_COMMUNITY): Payer: Self-pay | Admitting: Obstetrics & Gynecology

## 2020-12-24 ENCOUNTER — Other Ambulatory Visit: Payer: Self-pay

## 2020-12-24 DIAGNOSIS — Z3A38 38 weeks gestation of pregnancy: Secondary | ICD-10-CM | POA: Diagnosis not present

## 2020-12-24 DIAGNOSIS — Z87891 Personal history of nicotine dependence: Secondary | ICD-10-CM | POA: Insufficient documentation

## 2020-12-24 DIAGNOSIS — O219 Vomiting of pregnancy, unspecified: Secondary | ICD-10-CM

## 2020-12-24 DIAGNOSIS — O99820 Streptococcus B carrier state complicating pregnancy: Secondary | ICD-10-CM

## 2020-12-24 DIAGNOSIS — R102 Pelvic and perineal pain: Secondary | ICD-10-CM | POA: Diagnosis present

## 2020-12-24 MED ORDER — MORPHINE SULFATE (PF) 4 MG/ML IV SOLN
4.0000 mg | Freq: Once | INTRAVENOUS | Status: AC
  Administered 2020-12-24: 4 mg via INTRAMUSCULAR
  Filled 2020-12-24: qty 1

## 2020-12-24 MED ORDER — MORPHINE SULFATE (PF) 4 MG/ML IV SOLN
4.0000 mg | Freq: Once | INTRAVENOUS | Status: DC
  Filled 2020-12-24: qty 1

## 2020-12-24 MED ORDER — ZOLPIDEM TARTRATE 5 MG PO TABS
5.0000 mg | ORAL_TABLET | Freq: Once | ORAL | Status: AC
  Administered 2020-12-24: 5 mg via ORAL
  Filled 2020-12-24: qty 1

## 2020-12-24 NOTE — MAU Provider Note (Addendum)
History    CSN: [id number] Arrival date and time: 12/24/20 1728   Event Date/Time   First Provider Initiated Contact with Patient 12/24/20 1919     Chief Complaint  Patient presents with  . Pelvic Pain   HPI  Kristie Mcintosh is a G2P1001 at [redacted]w[redacted]d presenting with pelvic pain x3 days. Located at public symphysis and worsens every couple minutes. Pain radiates to her back. Rates 10 out of 10 when the belly tightening is happening. She unsure about whether she is having contractions. Reports good FM, no loss of fluid or vaginal discharge. For most of the interview she was merely saying "It hurts, it hurts"  OB History    Gravida  2   Para  1   Term  1   Preterm  0   AB  0   Living  1     SAB  0   IAB  0   Ectopic  0   Multiple  0   Live Births  1           Past Medical History:  Diagnosis Date  . Abdominal pain affecting pregnancy 05/16/2017  . Amenorrhea 05/16/2017  . Asthma   . Bacterial vaginitis 05/16/2017  . Headache(784.0)   . Obesity     Past Surgical History:  Procedure Laterality Date  . ADENOIDECTOMY AND MYRINGOTOMY WITH TUBE PLACEMENT Bilateral 2001  . WISDOM TOOTH EXTRACTION Bilateral     Family History  Problem Relation Age of Onset  . Headache Mother   . Heart disease Mother   . Migraines Maternal Grandmother   . Depression Maternal Grandmother   . Heart Problems Maternal Grandfather   . Bipolar disorder Paternal Aunt   . Autism Cousin        2 Maternal 1st Cousins have Autism  . Hyperlipidemia Other   . Diabetes Other   . Heart disease Other   . Hypertension Other     Social History   Tobacco Use  . Smoking status: Former Smoker    Packs/day: 0.25    Types: Cigarettes    Quit date: 04/25/2017    Years since quitting: 3.6  . Smokeless tobacco: Never Used  Vaping Use  . Vaping Use: Never used  Substance Use Topics  . Alcohol use: No  . Drug use: Not Currently    Allergies:  Allergies  Allergen Reactions  . Eggs Or  Egg-Derived Products Anaphylaxis  . 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 Penicillin G Anaphylaxis    Has patient had a PCN reaction causing immediate rash, facial/tongue/throat swelling, SOB or lightheadedness with hypotension: yes Has patient had a PCN reaction causing severe rash involving mucus membranes or skin necrosis: yes Has patient had a PCN reaction that required hospitalization: no Has patient had a PCN reaction occurring within the last 10 years: yes If all of the above answers are "NO", then may proceed with Cephalosporin use.  . Promethazine Hives and Nausea And Vomiting    Medications Prior to Admission  Medication Sig Dispense Refill Last Dose  . Blood Pressure Monitoring (BLOOD PRESSURE KIT) DEVI 1 Device by Does not apply route as needed. 1 each 0 More than a month at Unknown time  . Elastic Bandages & Supports (COMFORT FIT MATERNITY SUPP MED) MISC 1 Device by Does not apply route daily. (Patient not taking: No sig reported) 1 each 0   . hydrocortisone (ANUSOL-HC) 25 MG suppository Place 1 suppository (25 mg total)  rectally 2 (two) times daily. (Patient not taking: No sig reported) 12 suppository 0   . Misc. Devices (GOJJI WEIGHT SCALE) MISC 1 Device by Does not apply route as needed. (Patient not taking: No sig reported) 1 each 0   . Prenatal Vit-Fe Fumarate-FA (MULTIVITAMIN-PRENATAL) 27-0.8 MG TABS tablet Take 1 tablet by mouth daily at 12 noon. (Patient not taking: No sig reported)       Review of Systems  Constitutional: Negative for chills and fever.  HENT: Negative for congestion and sore throat.   Eyes: Negative for pain and visual disturbance.  Respiratory: Negative for cough, chest tightness and shortness of breath.   Cardiovascular: Negative for chest pain.  Gastrointestinal: Negative for abdominal pain, diarrhea, nausea and vomiting.  Endocrine: Negative for cold intolerance and heat intolerance.  Genitourinary: Negative for dysuria and flank  pain.  Musculoskeletal: Negative for back pain.  Skin: Negative for rash.  Allergic/Immunologic: Negative for food allergies.  Neurological: Negative for dizziness and light-headedness.  Psychiatric/Behavioral: Negative for agitation.     Physical Exam   Blood pressure 133/77, pulse 74, temperature 98.4 F (36.9 C), temperature source Oral, resp. rate 18, weight 82.1 kg, SpO2 100 %, currently breastfeeding.  Physical Exam Vitals and nursing note reviewed.  Constitutional:      General: She is not in acute distress.    Appearance: She is well-developed and well-nourished.     Comments: Pregnant female  HENT:     Head: Normocephalic and atraumatic.  Eyes:     General: No scleral icterus.    Conjunctiva/sclera: Conjunctivae normal.  Cardiovascular:     Rate and Rhythm: Normal rate.     Pulses: Intact distal pulses.  Pulmonary:     Effort: Pulmonary effort is normal.  Chest:     Chest wall: No tenderness.  Abdominal:     Palpations: Abdomen is soft.     Tenderness: There is no abdominal tenderness. There is no guarding or rebound.     Comments: Gravid  Genitourinary:    Vagina: Normal.  Musculoskeletal:        General: No edema. Normal range of motion.     Cervical back: Normal range of motion and neck supple.  Skin:    General: Skin is warm and dry.     Findings: No rash.  Neurological:     Mental Status: She is alert and oriented to person, place, and time.  Psychiatric:        Mood and Affect: Mood and affect normal.    Dilation: 3 Effacement (%): 50 Cervical Position: Middle Station: -3 Presentation: Vertex Exam by:: Odel Schmid   MAU Course  Procedures  MDM  8:09 PM Was seeing the patient and observed that pain worsens when I am able to palpate to contractions that are mild to moderate.   Will try therapuetic rest with morphine $RemoveBefor'4mg'sIpvyhossXDS$  IM, morphine 4 mg [place] and ambrien $RemoveBef'5mg'HkQyffOWQc$    NST: 130s/mod/+accels, no decels Toco: irritable patient moving  8:52 PM Patient  does not want additional morphine and only received IM dose and ambien. She is emotional and disclosed she is terrified and "just wants to call her mother but she can't because she is dead." Provided emotional support and active listening.  Discussed need for GBS recollection due to not having sensitivities. She confirmed PCN allergy.   9:28 PM  Patient much more relaxed and has had one contractions since being on monitor.  I collected a GBS swab with sensitivities. She reports feeling safe to  go home. We discussed reasons to return to MAU. Support Darian also voiced understanding of reasons to return.  Repeat cervical check was unchanged.   Repeat NST: 130s/mod/+accels no decels   Assessment and Plan   #Early labor - Therapeutic rest given in MAU - Return precautions - Has follow up appointment - Reactive NST/EFM  #GBS Positive  - did not have sensitivities collected outpatient. Recollected swab with sensitivities   Caren Macadam 12/24/2020, 9:28 PM

## 2020-12-24 NOTE — MAU Note (Signed)
Patient states pelvic pain and pressure that has been ongoing for the past 3 days.  Was in the office last Monday and was 2-3 cm.  Denies LOF/VB.  + FM.

## 2020-12-24 NOTE — Discharge Instructions (Signed)
Come to the MAU (maternity admission unit) for 1) Strong contractions every 2-3 minutes for at least 1 hour that do not go away when you drink water or take a warm shower. These contractions will be so strong all you can do is breath through them 2) Vaginal bleeding- anything more than spotting 3) Loss of fluid like you broke your water 4) Decreased movement of your baby  

## 2020-12-25 ENCOUNTER — Ambulatory Visit (INDEPENDENT_AMBULATORY_CARE_PROVIDER_SITE_OTHER): Payer: Medicaid Other | Admitting: Obstetrics and Gynecology

## 2020-12-25 VITALS — BP 128/81 | HR 97 | Wt 183.5 lb

## 2020-12-25 DIAGNOSIS — F988 Other specified behavioral and emotional disorders with onset usually occurring in childhood and adolescence: Secondary | ICD-10-CM

## 2020-12-25 DIAGNOSIS — O99820 Streptococcus B carrier state complicating pregnancy: Secondary | ICD-10-CM

## 2020-12-25 DIAGNOSIS — E669 Obesity, unspecified: Secondary | ICD-10-CM

## 2020-12-25 DIAGNOSIS — O26893 Other specified pregnancy related conditions, third trimester: Secondary | ICD-10-CM

## 2020-12-25 DIAGNOSIS — Z3480 Encounter for supervision of other normal pregnancy, unspecified trimester: Secondary | ICD-10-CM

## 2020-12-25 DIAGNOSIS — R109 Unspecified abdominal pain: Secondary | ICD-10-CM

## 2020-12-25 DIAGNOSIS — Z3A38 38 weeks gestation of pregnancy: Secondary | ICD-10-CM

## 2020-12-25 MED ORDER — CYCLOBENZAPRINE HCL 10 MG PO TABS: 10.0000 mg | ORAL_TABLET | Freq: Three times a day (TID) | ORAL | 1 refills | Status: DC | PRN

## 2020-12-25 NOTE — Progress Notes (Signed)
   PRENATAL VISIT NOTE  Subjective:  Kristie Mcintosh is a 20 y.o. G2P1001 at [redacted]w[redacted]d being seen today for ongoing prenatal care.  She is currently monitored for the following issues for this low-risk pregnancy and has Tension headache; Migraine without aura and without status migrainosus, not intractable; Insomnia; Circadian rhythm sleep disorder, irregular sleep wake type; ADD (attention deficit disorder); Obesity (BMI 30-39.9); Bacterial vaginitis; Enlarged tonsils; Encounter for supervision of normal pregnancy, antepartum; Nausea and vomiting during pregnancy; Group B Streptococcus carrier, +RV culture, currently pregnant; [redacted] weeks gestation of pregnancy; and Abdominal pain during pregnancy in third trimester on their problem list.  Patient doing well with persistent lower back and lower pelvic pain. She reports backache, occasional contractions and lower pelvic pain.  Contractions: Irregular. Vag. Bleeding: None.  Movement: Present. Denies leaking of fluid.   Pt was seen in MAU on 11/3020 with complaints of pelvic pain.  Pt was given pain medicine and monitored.  She was discharged and asked to attend follow up today.  Pt still remains in some discomfort radiating from her back and lower pelvis.  Discussed elective induction of labor and risk for cesarean section due to failure to dilate or nonreassuring fetal tracing.  Pt desires IOL at 40 weeks.  The following portions of the patient's history were reviewed and updated as appropriate: allergies, current medications, past family history, past medical history, past social history, past surgical history and problem list. Problem list updated.  Objective:   Vitals:   12/25/20 1445  BP: 128/81  Pulse: 97  Weight: 183 lb 8 oz (83.2 kg)    Fetal Status: Fetal Heart Rate (bpm): 145 (Simultaneous filing. User may not have seen previous data.)   Movement: Present     General:  Alert, oriented and cooperative. Patient is in no acute distress.  Skin:  Skin is warm and dry. No rash noted.   Cardiovascular: Normal heart rate noted  Respiratory: Normal respiratory effort, no problems with respiration noted  Abdomen: Soft, gravid, appropriate for gestational age.  Pain/Pressure: Present     Pelvic: Cervical exam performed Dilation: 1.5 Effacement (%): 50,60 Station: -3  Extremities: Normal range of motion.  Edema: Trace  Mental Status:  Normal mood and affect. Normal behavior. Normal judgment and thought content.   Assessment and Plan:  Pregnancy: G2P1001 at [redacted]w[redacted]d  1. Attention deficit disorder, unspecified hyperactivity presence   2. Obesity (BMI 30-39.9)   3. Supervision of other normal pregnancy, antepartum IOL at 40 weeks  4. Group B Streptococcus carrier, +RV culture, currently pregnant Treat in labor, awaiting sensitivities  5. [redacted] weeks gestation of pregnancy   6. Abdominal pain during pregnancy in third trimester rx for flexeril, also advised heating pad for her back - cyclobenzaprine (FLEXERIL) 10 MG tablet; Take 1 tablet (10 mg total) by mouth 3 (three) times daily as needed for muscle spasms.  Dispense: 30 tablet; Refill: 1  Term labor symptoms and general obstetric precautions including but not limited to vaginal bleeding, contractions, leaking of fluid and fetal movement were reviewed in detail with the patient.  Please refer to After Visit Summary for other counseling recommendations.   Return in about 1 week (around 01/01/2021) for ROB, in person.   Mariel Aloe, MD

## 2020-12-25 NOTE — Patient Instructions (Signed)
Labor Induction Labor induction is when steps are taken to cause a pregnant woman to begin the labor process. Most women go into labor on their own between 37 weeks and 42 weeks of pregnancy. When this does not happen, or when there is a medical need for labor to begin, steps may be taken to induce, or bring on, labor. Labor induction causes a pregnant woman's uterus to contract. It also causes the cervix to soften (ripen), open (dilate), and thin out. Usually, labor is not induced before 39 weeks of pregnancy unless there is a medical reason to do so. When is labor induction considered? Labor induction may be right for you if:  Your pregnancy lasts longer than 41 to 42 weeks.  Your placenta is separating from your uterus (placental abruption).  You have a rupture of membranes and your labor does not begin.  You have health problems, like diabetes or high blood pressure (preeclampsia) during your pregnancy.  Your baby has stopped growing or does not have enough amniotic fluid. Before labor induction begins, your health care provider will consider the following factors:  Your medical condition and the baby's condition.  How many weeks you have been pregnant.  How mature the baby's lungs are.  The condition of your cervix.  The position of the baby.  The size of your birth canal. Tell a health care provider about:  Any allergies you have.  All medicines you are taking, including vitamins, herbs, eye drops, creams, and over-the-counter medicines.  Any problems you or your family members have had with anesthetic medicines.  Any surgeries you have had.  Any blood disorders you have.  Any medical conditions you have. What are the risks? Generally, this is a safe procedure. However, problems may occur, including:  Failed induction.  Changes in fetal heart rate, such as being too high, too low, or irregular (erratic).  Infection in the mother or the baby.  Increased risk of  having a cesarean delivery.  Breaking off (abruption) of the placenta from the uterus. This is rare.  Rupture of the uterus. This is very rare.  Your baby could fail to get enough blood flow or oxygen. This can be life-threatening. When induction is needed for medical reasons, the benefits generally outweigh the risks. What happens during the procedure? During the procedure, your health care provider will use one of these methods to induce labor:  Stripping the membranes. In this method, the amniotic sac tissue is gently separated from the cervix. This causes the following to happen: ? Your cervix stretches, which in turn causes the release of prostaglandins. ? Prostaglandins induce labor and cause the uterus to contract. ? This procedure is often done in an office visit. You will be sent home to wait for contractions to begin.  Prostaglandin medicine. This medicine starts contractions and causes the cervix to dilate and ripen. This can be taken by mouth (orally) or by being inserted into the vagina (suppository).  Inserting a small, thin tube (catheter) with a balloon into the vagina and then expanding the balloon with water to dilate the cervix.  Breaking the water. In this method, a small instrument is used to make a small hole in the amniotic sac. This eventually causes the amniotic sac to break. Contractions should begin within a few hours.  Medicine to trigger or strengthen contractions. This medicine is given through an IV that is inserted into a vein in your arm. This procedure may vary among health care providers and hospitals.     Where to find more information  [employer] of [employer]: www.marchofdimes.org  The American College of Obstetricians and Gynecologists: www.acog.org Summary  Labor induction causes a pregnant woman's uterus to contract. It also causes the cervix to soften (ripen), open (dilate), and thin out.  Labor is usually not induced before 39 weeks of pregnancy unless  there is a medical reason to do so.  When induction is needed for medical reasons, the benefits generally outweigh the risks.  Talk with your health care provider about which methods of labor induction are right for you. This information is not intended to replace advice given to you by your health care provider. Make sure you discuss any questions you have with your health care provider. Document Revised: 08/24/2020 Document Reviewed: 08/24/2020 Elsevier Patient Education  2021 Elsevier Inc.  

## 2020-12-25 NOTE — Progress Notes (Signed)
Patient presents for ROB. Patient was seen yesterday at MAU for pelvic pain and contractions. Cervix unchanged.

## 2020-12-26 ENCOUNTER — Other Ambulatory Visit: Payer: Self-pay

## 2020-12-26 ENCOUNTER — Encounter (HOSPITAL_COMMUNITY): Payer: Self-pay | Admitting: Obstetrics and Gynecology

## 2020-12-26 ENCOUNTER — Inpatient Hospital Stay (HOSPITAL_COMMUNITY)
Admission: AD | Admit: 2020-12-26 | Discharge: 2020-12-28 | DRG: 807 | Disposition: A | Discharge status: Home / Self Care | Payer: Medicaid Other | Attending: Obstetrics and Gynecology | Admitting: Obstetrics and Gynecology

## 2020-12-26 ENCOUNTER — Encounter: Payer: Medicaid Other | Admitting: Advanced Practice Midwife

## 2020-12-26 DIAGNOSIS — Z3A38 38 weeks gestation of pregnancy: Secondary | ICD-10-CM

## 2020-12-26 DIAGNOSIS — Z8616 Personal history of COVID-19: Secondary | ICD-10-CM | POA: Diagnosis not present

## 2020-12-26 DIAGNOSIS — Z880 Allergy status to penicillin: Secondary | ICD-10-CM

## 2020-12-26 DIAGNOSIS — O219 Vomiting of pregnancy, unspecified: Secondary | ICD-10-CM | POA: Diagnosis present

## 2020-12-26 DIAGNOSIS — O26893 Other specified pregnancy related conditions, third trimester: Secondary | ICD-10-CM | POA: Diagnosis present

## 2020-12-26 DIAGNOSIS — O99214 Obesity complicating childbirth: Secondary | ICD-10-CM | POA: Diagnosis present

## 2020-12-26 DIAGNOSIS — Z87891 Personal history of nicotine dependence: Secondary | ICD-10-CM | POA: Diagnosis not present

## 2020-12-26 DIAGNOSIS — O99820 Streptococcus B carrier state complicating pregnancy: Secondary | ICD-10-CM

## 2020-12-26 DIAGNOSIS — O99824 Streptococcus B carrier state complicating childbirth: Secondary | ICD-10-CM | POA: Diagnosis present

## 2020-12-26 DIAGNOSIS — Z3490 Encounter for supervision of normal pregnancy, unspecified, unspecified trimester: Secondary | ICD-10-CM

## 2020-12-26 DIAGNOSIS — Z20822 Contact with and (suspected) exposure to covid-19: Secondary | ICD-10-CM | POA: Diagnosis present

## 2020-12-26 DIAGNOSIS — E669 Obesity, unspecified: Secondary | ICD-10-CM | POA: Diagnosis present

## 2020-12-26 LAB — RPR: RPR Ser Ql: NONREACTIVE

## 2020-12-26 LAB — CBC
HCT: 28.3 % — ABNORMAL LOW (ref 36.0–46.0)
Hemoglobin: 8.8 g/dL — ABNORMAL LOW (ref 12.0–15.0)
MCH: 28.2 pg (ref 26.0–34.0)
MCHC: 31.1 g/dL (ref 30.0–36.0)
MCV: 90.7 fL (ref 80.0–100.0)
Platelets: 170 10*3/uL (ref 150–400)
RBC: 3.12 MIL/uL — ABNORMAL LOW (ref 3.87–5.11)
RDW: 13.2 % (ref 11.5–15.5)
WBC: 8.8 10*3/uL (ref 4.0–10.5)
nRBC: 0 % (ref 0.0–0.2)

## 2020-12-26 LAB — TYPE AND SCREEN
ABO/RH(D): O POS
Antibody Screen: NEGATIVE

## 2020-12-26 LAB — CULTURE, BETA STREP (GROUP B ONLY)

## 2020-12-26 LAB — SARS CORONAVIRUS 2 BY RT PCR (HOSPITAL ORDER, PERFORMED IN ~~LOC~~ HOSPITAL LAB): SARS Coronavirus 2: NEGATIVE

## 2020-12-26 MED ORDER — ONDANSETRON HCL 4 MG/2ML IJ SOLN
4.0000 mg | Freq: Once | INTRAMUSCULAR | Status: AC
  Administered 2020-12-26: 4 mg via INTRAVENOUS
  Filled 2020-12-26: qty 2

## 2020-12-26 MED ORDER — SOD CITRATE-CITRIC ACID 500-334 MG/5ML PO SOLN: 30.0000 mL | ORAL | Status: DC | PRN

## 2020-12-26 MED ORDER — LACTATED RINGERS IV SOLN: INTRAVENOUS | Status: DC

## 2020-12-26 MED ORDER — COCONUT OIL OIL
1.0000 "application " | TOPICAL_OIL | Status: DC | PRN
  Administered 2020-12-26: 1 via TOPICAL

## 2020-12-26 MED ORDER — FENTANYL CITRATE (PF) 100 MCG/2ML IJ SOLN
100.0000 ug | Freq: Once | INTRAMUSCULAR | Status: AC
  Administered 2020-12-26: 100 ug via INTRAVENOUS
  Filled 2020-12-26: qty 2

## 2020-12-26 MED ORDER — PRENATAL MULTIVITAMIN CH
1.0000 | ORAL_TABLET | Freq: Every day | ORAL | Status: DC
  Administered 2020-12-26 – 2020-12-27 (×2): 1 via ORAL
  Filled 2020-12-26 (×2): qty 1

## 2020-12-26 MED ORDER — OXYTOCIN BOLUS FROM INFUSION
333.0000 mL | Freq: Once | INTRAVENOUS | Status: AC
  Administered 2020-12-26: 333 mL via INTRAVENOUS

## 2020-12-26 MED ORDER — TETANUS-DIPHTH-ACELL PERTUSSIS 5-2.5-18.5 LF-MCG/0.5 IM SUSY: 0.5000 mL | PREFILLED_SYRINGE | Freq: Once | INTRAMUSCULAR | Status: DC

## 2020-12-26 MED ORDER — ACETAMINOPHEN 325 MG PO TABS
650.0000 mg | ORAL_TABLET | ORAL | Status: DC | PRN
  Administered 2020-12-26 – 2020-12-27 (×4): 650 mg via ORAL
  Filled 2020-12-26 (×4): qty 2

## 2020-12-26 MED ORDER — DIBUCAINE (PERIANAL) 1 % EX OINT: 1.0000 "application " | TOPICAL_OINTMENT | CUTANEOUS | Status: DC | PRN

## 2020-12-26 MED ORDER — SENNOSIDES-DOCUSATE SODIUM 8.6-50 MG PO TABS
2.0000 | ORAL_TABLET | ORAL | Status: DC
  Administered 2020-12-26 – 2020-12-27 (×2): 2 via ORAL
  Filled 2020-12-26 (×2): qty 2

## 2020-12-26 MED ORDER — BENZOCAINE-MENTHOL 20-0.5 % EX AERO
1.0000 "application " | INHALATION_SPRAY | CUTANEOUS | Status: DC | PRN
  Administered 2020-12-26: 1 via TOPICAL
  Filled 2020-12-26: qty 56

## 2020-12-26 MED ORDER — LIDOCAINE HCL 1 % IJ SOLN: 0.0000 mL | Freq: Once | INTRAMUSCULAR | Status: DC | PRN

## 2020-12-26 MED ORDER — WITCH HAZEL-GLYCERIN EX PADS: 1.0000 "application " | MEDICATED_PAD | CUTANEOUS | Status: DC | PRN

## 2020-12-26 MED ORDER — ONDANSETRON HCL 4 MG/2ML IJ SOLN: 4.0000 mg | Freq: Four times a day (QID) | INTRAMUSCULAR | Status: DC | PRN

## 2020-12-26 MED ORDER — ONDANSETRON HCL 4 MG/2ML IJ SOLN: 4.0000 mg | INTRAMUSCULAR | Status: DC | PRN

## 2020-12-26 MED ORDER — LIDOCAINE HCL (PF) 1 % IJ SOLN: 30.0000 mL | INTRAMUSCULAR | Status: DC | PRN

## 2020-12-26 MED ORDER — LACTATED RINGERS IV BOLUS
1000.0000 mL | Freq: Once | INTRAVENOUS | Status: AC
  Administered 2020-12-26: 1000 mL via INTRAVENOUS

## 2020-12-26 MED ORDER — ONDANSETRON HCL 4 MG PO TABS: 4.0000 mg | ORAL_TABLET | ORAL | Status: DC | PRN

## 2020-12-26 MED ORDER — FENTANYL CITRATE (PF) 100 MCG/2ML IJ SOLN: 50.0000 ug | INTRAMUSCULAR | Status: DC | PRN

## 2020-12-26 MED ORDER — IBUPROFEN 600 MG PO TABS
600.0000 mg | ORAL_TABLET | Freq: Four times a day (QID) | ORAL | Status: DC
  Administered 2020-12-26 – 2020-12-28 (×9): 600 mg via ORAL
  Filled 2020-12-26 (×9): qty 1

## 2020-12-26 MED ORDER — MEASLES, MUMPS & RUBELLA VAC IJ SOLR: 0.5000 mL | Freq: Once | INTRAMUSCULAR | Status: DC

## 2020-12-26 MED ORDER — DIPHENHYDRAMINE HCL 25 MG PO CAPS: 25.0000 mg | ORAL_CAPSULE | Freq: Four times a day (QID) | ORAL | Status: DC | PRN

## 2020-12-26 MED ORDER — OXYCODONE-ACETAMINOPHEN 5-325 MG PO TABS: 1.0000 | ORAL_TABLET | ORAL | Status: DC | PRN

## 2020-12-26 MED ORDER — ACETAMINOPHEN 325 MG PO TABS
650.0000 mg | ORAL_TABLET | ORAL | Status: DC | PRN
  Administered 2020-12-26: 650 mg via ORAL
  Filled 2020-12-26: qty 2

## 2020-12-26 MED ORDER — SIMETHICONE 80 MG PO CHEW
80.0000 mg | CHEWABLE_TABLET | ORAL | Status: DC | PRN
  Administered 2020-12-26 – 2020-12-27 (×2): 80 mg via ORAL
  Filled 2020-12-26 (×2): qty 1

## 2020-12-26 MED ORDER — ETONOGESTREL 68 MG ~~LOC~~ IMPL: 68.0000 mg | DRUG_IMPLANT | Freq: Once | SUBCUTANEOUS | Status: DC

## 2020-12-26 MED ORDER — OXYTOCIN-SODIUM CHLORIDE 30-0.9 UT/500ML-% IV SOLN
2.5000 [IU]/h | INTRAVENOUS | Status: DC
  Administered 2020-12-26: 2.5 [IU]/h via INTRAVENOUS
  Filled 2020-12-26: qty 500

## 2020-12-26 MED ORDER — LACTATED RINGERS IV SOLN: 500.0000 mL | INTRAVENOUS | Status: DC | PRN

## 2020-12-26 MED ORDER — FENTANYL CITRATE (PF) 100 MCG/2ML IJ SOLN: 25.0000 ug | Freq: Once | INTRAMUSCULAR | Status: DC

## 2020-12-26 MED ORDER — OXYCODONE-ACETAMINOPHEN 5-325 MG PO TABS
2.0000 | ORAL_TABLET | ORAL | Status: DC | PRN
Start: 2020-12-26 — End: 2020-12-26

## 2020-12-26 NOTE — MAU Note (Signed)
Patient brought directly into room from registration reporting intense contractions. +FM. Denies vaginal bleeding or leaking of fluid.

## 2020-12-26 NOTE — MAU Provider Note (Signed)
None      S: [name] is a 20 y.o. G2P1001 at [redacted]w[redacted]d  who presents to MAU today in acute distress, charge RN called to registration to get patient immediately. Screaming with every contraction and yelling "it hurts so bad." FOB at bedside, stated this began to get worse this afternoon. She was seen in MAU on 12/24/20 in false labor and given therapeutic rest before discharge. She denies vaginal bleeding. She denies LOF. She reports normal fetal movement.    O: LMP  (LMP Unknown)  GENERAL: Well-developed, well-nourished female in physical distress due to possible advancing labor HEAD: Normocephalic, atraumatic.  CHEST: Normal effort of breathing, regular heart rate ABDOMEN: Soft, nontender, gravid  First Cervical exam:  Dilation: 4 Effacement (%): 90 Cervical Position: Middle Station: -2 Presentation: Vertex Exam by:: Edd Arbour, CNM  Fentanyl and zofran given IV for pain relief LR bolus ordered after baby had a few late variable decels  Second cervical exam: 5.5/90/-2  Fetal Monitoring: reactive Baseline: 125 Variability: moderate Accelerations: present Decelerations: occasional late variable decels Contractions: q 4-87min   Report called to L&D (Dr. Germaine Pomfret) for admission  A: [facility]UP at [redacted]w[redacted]d  Active labor  P: Admit to L&D for SOL   Bernerd Limbo, CNM 12/26/2020 1:26 AM

## 2020-12-26 NOTE — H&P (Signed)
OBSTETRIC ADMISSION HISTORY AND PHYSICAL  Kristie Mcintosh is a 20 y.o. female G2P1001 with IUP at [redacted]w[redacted]d by early u/s presenting for SOL. She reports +FMs, No LOF, no VB, no blurry vision, headaches or peripheral edema, and RUQ pain.  She plans on breast and bottle feeding. She request nexplanon for birth control. She received her prenatal care at [facility]: By early u/s --->  Estimated Date of Delivery: 01/07/21  Sono:    08/24/20$Remove'@[redacted]w[redacted]d'OClOCDr$ , CWD, normal anatomy, breech presentation, anterior placental lie, 355g   Prenatal History/Complications:  GBS positive COVID +1/11 per patient BMI 37   Past Medical History: Past Medical History:  Diagnosis Date  . Abdominal pain affecting pregnancy 05/16/2017  . Amenorrhea 05/16/2017  . Asthma   . Bacterial vaginitis 05/16/2017  . Headache(784.0)   . Obesity     Past Surgical History: Past Surgical History:  Procedure Laterality Date  . ADENOIDECTOMY AND MYRINGOTOMY WITH TUBE PLACEMENT Bilateral 2001  . WISDOM TOOTH EXTRACTION Bilateral     Obstetrical History: OB History    Gravida  2   Para  1   Term  1   Preterm  0   AB  0   Living  1     SAB  0   IAB  0   Ectopic  0   Multiple  0   Live Births  1           Social History Social History   Socioeconomic History  . Marital status: Significant Other    Spouse name: Not on file  . Number of children: Not on file  . Years of education: Not on file  . Highest education level: Not on file  Occupational History  . Not on file  Tobacco Use  . Smoking status: Former Smoker    Packs/day: 0.25    Types: Cigarettes    Quit date: 04/25/2017    Years since quitting: 3.6  . Smokeless tobacco: Never Used  Vaping Use  . Vaping Use: Never used  Substance and Sexual Activity  . Alcohol use: No  . Drug use: Not Currently  . Sexual activity: Yes    Birth control/protection: None  Other Topics Concern  . Not on file  Social History Narrative  . Not on file    Social Determinants of Health   Financial Resource Strain: Not on file  Food Insecurity: Not on file  Transportation Needs: Not on file  Physical Activity: Not on file  Stress: Not on file  Social Connections: Not on file    Family History: Family History  Problem Relation Age of Onset  . Headache Mother   . Heart disease Mother   . Migraines Maternal Grandmother   . Depression Maternal Grandmother   . Heart Problems Maternal Grandfather   . Bipolar disorder Paternal Aunt   . Autism Cousin        2 Maternal 1st Cousins have Autism  . Hyperlipidemia Other   . Diabetes Other   . Heart disease Other   . Hypertension Other     Allergies: Allergies  Allergen Reactions  . Eggs Or Egg-Derived Products Anaphylaxis  . 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 Penicillin G Anaphylaxis    Has patient had a PCN reaction causing immediate rash, facial/tongue/throat swelling, SOB or lightheadedness with hypotension: yes Has patient had a PCN reaction causing severe rash involving mucus membranes or skin necrosis: yes Has patient had a PCN reaction  that required hospitalization: no Has patient had a PCN reaction occurring within the last 10 years: yes If all of the above answers are "NO", then may proceed with Cephalosporin use.  . Promethazine Hives and Nausea And Vomiting    Medications Prior to Admission  Medication Sig Dispense Refill Last Dose  . Blood Pressure Monitoring (BLOOD PRESSURE KIT) DEVI 1 Device by Does not apply route as needed. 1 each 0   . cyclobenzaprine (FLEXERIL) 10 MG tablet Take 1 tablet (10 mg total) by mouth 3 (three) times daily as needed for muscle spasms. 30 tablet 1   . Elastic Bandages & Supports (COMFORT FIT MATERNITY SUPP MED) MISC 1 Device by Does not apply route daily. 1 each 0   . hydrocortisone (ANUSOL-HC) 25 MG suppository Place 1 suppository (25 mg total) rectally 2 (two) times daily. 12 suppository 0   . Misc. Devices (GOJJI WEIGHT  SCALE) MISC 1 Device by Does not apply route as needed. 1 each 0   . Prenatal Vit-Fe Fumarate-FA (MULTIVITAMIN-PRENATAL) 27-0.8 MG TABS tablet Take 1 tablet by mouth daily at 12 noon.        Review of Systems   All systems reviewed and negative except as stated in HPI  currently breastfeeding. General appearance: alert, cooperative and severe distress Lungs: normal respiratory effort Heart: regular rate and rhythm Abdomen: soft, non-tender; gravid Pelvic: as noted below Extremities: Homans sign is negative, no sign of DVT Presentation: cephalic by cervical exam Fetal monitoringBaseline: 120 bpm, Variability: Good {> 6 bpm), Accelerations: Reactive and Decelerations: Absent Uterine activityFrequency: Every 1-2 minutes Dilation: 10 Effacement (%): 90 Station: -2,-1 Exam by:: Dr. Germaine Pomfret   Prenatal labs: ABO, Rh: --/--/O POS (02/01 0020) Antibody: NEG (02/01 0020) Rubella: 2.05 (07/29 1137) RPR: Non Reactive (11/10 1000)  HBsAg: Negative (07/29 1137)  HIV: Non Reactive (11/10 1000)  GBS: Positive/-- (01/18 0434)  2 hr Glucola passed Genetic screening  normal Anatomy US normal  Prenatal Transfer Tool  Maternal Diabetes: No Genetic Screening: Normal Maternal Ultrasounds/Referrals: Normal Fetal Ultrasounds or other Referrals:  None Maternal Substance Abuse:  No Significant Maternal Medications:  None Significant Maternal Lab Results: Group B Strep positive  Results for orders placed or performed during the hospital encounter of 12/26/20 (from the past 24 hour(s))  Type and screen [facility]   Collection Time: 12/26/20 12:20 AM  Result Value Ref Range   ABO/RH(D) O POS    Antibody Screen NEG    Sample Expiration      12/29/2020,2359 Performed at [facility], [address], [place], [place] [address]     Patient Active Problem List   Diagnosis Date Noted  . Normal labor 12/26/2020  . Vaginal delivery 12/26/2020  . Group B Streptococcus  carrier, +RV culture, currently pregnant 12/16/2020  . Nausea and vomiting during pregnancy 06/22/2020  . Encounter for supervision of normal pregnancy, antepartum 06/15/2020  . Tension headache 08/11/2013  . Migraine without aura and without status migrainosus, not intractable 08/11/2013  . Insomnia 08/11/2013  . 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 ADD (attention deficit disorder) 08/11/2013  . Obesity (BMI 30-39.9) 08/11/2013    Assessment/Plan:  Kristie Mcintosh is a 20 y.o. G2P1001 at [redacted]w[redacted]d here for SOL.  #Labor: Patient progressed to complete upon presentation to L&D. #Pain: none given cervical dilation #FWB: Cat 1 #ID: GBS pos, untreated given spontaneous delivery upon arrival to L&D #MOF: both #MOC: inpatient nexplanon #Circ: yes  Helmut Muster  Zacarias Pontes, MD  12/26/2020, 2:10 AM

## 2020-12-26 NOTE — Discharge Summary (Signed)
Postpartum Discharge Summary  Date of Service updated 12/28/20     Patient Name: Kristie Mcintosh DOB: November 25, 2001 MRN: [id number]  Date of admission: 12/26/2020 Delivery date:12/26/2020  Delivering provider: Arrie Senate  Date of discharge: 12/28/2020  Admitting diagnosis: Normal labor [O80, Z37.9] Intrauterine pregnancy: [redacted]w[redacted]d     Secondary diagnosis:  Active Problems:   Obesity (BMI 30-39.9)   Encounter for supervision of normal pregnancy, antepartum   Nausea and vomiting during pregnancy   Group B Streptococcus carrier, +RV culture, currently pregnant   Normal labor   Vaginal delivery  Additional problems: none    Discharge diagnosis: Term Pregnancy Delivered                                              Post partum procedures:retained membranes/placental segment removed postpartum, patient tolerated well, uncomplicated Augmentation: AROM Complications: None  Hospital course: Onset of Labor With Vaginal Delivery      20 y.o. yo G2P1001 at [redacted]w[redacted]d was admitted in Active Labor on 12/26/2020. Patient progressed to complete upon arrival to L&D and was unable to receive IV pain medication or epidural. Screaming to get the baby out of her, AROM performed at that time and delivered. Patient had an uncomplicated labor course as follows:  Membrane Rupture Time/Date: 1:56 AM ,12/26/2020   Delivery Method:Vaginal, Spontaneous  Episiotomy: None  Lacerations:  None  Patient had an uncomplicated postpartum course.  She is ambulating, tolerating a regular diet, passing flatus, and urinating well. Patient is discharged home in stable condition on 12/28/20.  Newborn Data: Birth date:12/26/2020  Birth time:1:56 AM  Gender:Female  Living status:Living  Apgars:8 ,9  Weight:2546 g   Magnesium Sulfate received: No BMZ received: No Rhophylac:N/A MMR:N/A T-DaP:Given prenatally Flu: No Transfusion:No  Physical exam  Vitals:   12/26/20 2028 12/27/20 0600 12/27/20 1504 12/27/20 2031  BP:  117/81 (!) 102/59 110/74 116/83  Pulse: (!) 54 (!) 56 60 (!) 52  Resp: $Remo'15 16 18 17  'qkuYc$ Temp: 99 F (37.2 C) 98.1 F (36.7 C) 98.5 F (36.9 C) 98.8 F (37.1 C)  TempSrc: Oral Oral Oral Oral  SpO2: 100% 100% 100% 100%   General: alert, cooperative and no distress Lochia: appropriate Uterine Fundus: firm Incision: N/A DVT Evaluation: No evidence of DVT seen on physical exam. Labs: Lab Results  Component Value Date   WBC 8.8 12/26/2020   HGB 8.8 (L) 12/26/2020   HCT 28.3 (L) 12/26/2020   MCV 90.7 12/26/2020   PLT 170 12/26/2020   CMP Latest Ref Rng & Units 09/18/2020  Glucose 70 - 99 mg/dL 79  BUN 6 - 20 mg/dL <5(L)  Creatinine 0.44 - 1.00 mg/dL 0.53  Sodium 135 - 145 mmol/L 136  Potassium 3.5 - 5.1 mmol/L 3.9  Chloride 98 - 111 mmol/L 106  CO2 22 - 32 mmol/L 20(L)  Calcium 8.9 - 10.3 mg/dL 9.1  Total Protein 6.5 - 8.1 g/dL 7.0  Total Bilirubin 0.3 - 1.2 mg/dL 0.4  Alkaline Phos 38 - 126 U/L 64  AST 15 - 41 U/L 19  ALT 0 - 44 U/L 9   Edinburgh Score: Edinburgh Postnatal Depression Scale Screening Tool 12/27/2020  I have been able to laugh and see the funny side of things. 0  I have looked forward with enjoyment to things. 0  I have blamed myself unnecessarily when things went  wrong. 1  I have been anxious or worried for no good reason. 0  I have felt scared or panicky for no good reason. 0  Things have been getting on top of me. 0  I have been so unhappy that I have had difficulty sleeping. 0  I have felt sad or miserable. 0  I have been so unhappy that I have been crying. 0  The thought of harming myself has occurred to me. 0  Edinburgh Postnatal Depression Scale Total 1     After visit meds:  Allergies as of 12/28/2020      Reactions   Eggs Or Egg-derived Products Anaphylaxis   Peanuts [peanut Oil] Anaphylaxis   Tree nuts   Penicillin G Anaphylaxis   Has patient had a PCN reaction causing immediate rash, facial/tongue/throat swelling, SOB or lightheadedness with  hypotension: yes Has patient had a PCN reaction causing severe rash involving mucus membranes or skin necrosis: yes Has patient had a PCN reaction that required hospitalization: no Has patient had a PCN reaction occurring within the last 10 years: yes If all of the above answers are "NO", then may proceed with Cephalosporin use.   Promethazine Hives, Nausea And Vomiting      Medication List    STOP taking these medications   cyclobenzaprine 10 MG tablet Commonly known as: FLEXERIL     TAKE these medications   acetaminophen 500 MG tablet Commonly known as: TYLENOL Take 2 tablets (1,000 mg total) by mouth every 6 (six) hours as needed (for pain scale < 4).   Blood Pressure Kit Devi 1 Device by Does not apply route as needed.   coconut oil Oil Apply 1 application topically as needed.   Comfort Fit Maternity Supp Med Misc 1 Device by Does not apply route daily.   ferrous sulfate 325 (65 FE) MG tablet Take 1 tablet (325 mg total) by mouth every other day.   Gojji Weight Scale Misc 1 Device by Does not apply route as needed.   hydrocortisone 25 MG suppository Commonly known as: ANUSOL-HC Place 1 suppository (25 mg total) rectally 2 (two) times daily.   ibuprofen 600 MG tablet Commonly known as: ADVIL Take 1 tablet (600 mg total) by mouth every 6 (six) hours.   vitamin C 250 MG tablet Commonly known as: ASCORBIC ACID Take 1 tablet (250 mg total) by mouth every other day. Take with iron.        Discharge home in stable condition Infant Feeding: Bottle and Breast Infant Disposition:home with mother Discharge instruction: per After Visit Summary and Postpartum booklet. Activity: Advance as tolerated. Pelvic rest for 6 weeks.  Diet: routine diet Future Appointments: Future Appointments  Date Time Provider [place]  01/23/2021  8:55 AM Leftwich-Kirby, Kathie Dike, CNM [facility] None   Follow up Visit: Message sent to [facility] 12/26/20 by Sylvester Harder.   Please schedule  this patient for a In person postpartum visit in 4 weeks with the following provider: Any provider. Additional Postpartum F/U:none  Low risk pregnancy complicated by: n/a Delivery mode:  Vaginal, Spontaneous  Anticipated Birth Control:  Nexplanon, please place at postpartum visit, offered prior to discharge, patient declines   8/0/2233 Arrie Senate, MD

## 2020-12-27 MED ORDER — ACETAMINOPHEN 500 MG PO TABS
1000.0000 mg | ORAL_TABLET | Freq: Four times a day (QID) | ORAL | Status: DC | PRN
  Administered 2020-12-27 – 2020-12-28 (×2): 1000 mg via ORAL
  Filled 2020-12-27 (×2): qty 2

## 2020-12-27 MED ORDER — OXYCODONE HCL 5 MG PO TABS
5.0000 mg | ORAL_TABLET | Freq: Once | ORAL | Status: AC
  Administered 2020-12-27: 5 mg via ORAL
  Filled 2020-12-27: qty 1

## 2020-12-27 NOTE — Progress Notes (Signed)
Patient ID: Kristie Mcintosh, female   DOB: 09/28/2001, 20 y.o.   MRN: [id number]  Resident Dr. Idalia Needle called me to the report by RN staff of "something coming out of the patient" and a concern for a prolapse.  Upon inspection it was clearly retained POC, specifically amniotic sac that was hanging out of the vagina. Patient has minimal bleeding and fundus was firm.  Using ringed forceps and a twisting action I teased out the membranes.  There was about a 6-8cm membrane remnant and 2 cm attached placental fragment vs accessory lobe.  Removed easily. Fundal massage showed minimal bleeding and uterus remained firm throughout.   Federico Flake, MD, MPH, ABFM, [facility] Attending [facility]ician [facility] for [facility]

## 2020-12-28 MED ORDER — ACETAMINOPHEN 500 MG PO TABS: 1000.0000 mg | ORAL_TABLET | Freq: Four times a day (QID) | ORAL | 0 refills | Status: DC | PRN

## 2020-12-28 MED ORDER — COCONUT OIL OIL: 1.0000 "application " | TOPICAL_OIL | 0 refills | Status: DC | PRN

## 2020-12-28 MED ORDER — FERROUS SULFATE 325 (65 FE) MG PO TABS
325.0000 mg | ORAL_TABLET | ORAL | Status: DC
  Administered 2020-12-28: 325 mg via ORAL
  Filled 2020-12-28: qty 1

## 2020-12-28 MED ORDER — IBUPROFEN 600 MG PO TABS: 600.0000 mg | ORAL_TABLET | Freq: Four times a day (QID) | ORAL | 0 refills | Status: DC

## 2020-12-28 MED ORDER — FERROUS SULFATE 325 (65 FE) MG PO TABS: 325.0000 mg | ORAL_TABLET | ORAL | 3 refills | Status: DC

## 2020-12-28 MED ORDER — VITAMIN C 250 MG PO TABS: 250.0000 mg | ORAL_TABLET | ORAL | Status: DC

## 2020-12-28 NOTE — Discharge Instructions (Signed)
-take tylenol 1000 mg every 6 hours as needed for pain, alternate with ibuprofen 600 mg every 6 hours -drink plenty of water to help with breastfeeding -continue prenatal vitamins while you are breastfeeding -take iron pills every other day with vitamin c, this will help healing as well as breast feeding -think about birth control options-->bedisider.org is a great website!    Postpartum Care After Vaginal Delivery The following information offers guidance about how to care for yourself from the time you deliver your baby to 6-12 weeks after delivery (postpartum period). If you have problems or questions, contact your health care provider for more specific instructions. Follow these instructions at home: Vaginal bleeding  It is normal to have vaginal bleeding (lochia) after delivery. Wear a sanitary pad for bleeding and discharge. ? During the first week after delivery, the amount and appearance of lochia is often similar to a menstrual period. ? Over the next few weeks, it will gradually decrease to a dry, yellow-brown discharge. ? For most women, lochia stops completely by 4-6 weeks after delivery, but can vary.  Change your sanitary pads frequently. Watch for any changes in your flow, such as: ? A sudden increase in volume. ? A change in color. ? Large blood clots.  If you pass a blood clot from your vagina, save it and call your health care provider. Do not flush blood clots down the toilet before talking with your health care provider.  Do not use tampons or douches until your health care provider approves.  If you are not breastfeeding, your period should return 6-8 weeks after delivery. If you are feeding your baby breast milk only, your period may not return until you stop breastfeeding. Perineal care  Keep the area between the vagina and the anus (perineum) clean and dry. Use medicated pads and pain-relieving sprays and creams as directed.  If you had a surgical cut in the  perineum (episiotomy) or a tear, check the area for signs of infection until you are healed. Check for: ? More redness, swelling, or pain. ? Fluid or blood coming from the cut or tear. ? Warmth. ? Pus or a bad smell.  You may be given a squirt bottle to use instead of wiping to clean the perineum area after you use the bathroom. Pat the area gently to dry it.  To relieve pain caused by an episiotomy, a tear, or swollen veins in the anus (hemorrhoids), take a warm sitz bath 2-3 times a day. In a sitz bath, the warm water should only come up to your hips and cover your buttocks.   Breast care  In the first few days after delivery, your breasts may feel heavy, full, and uncomfortable (breast engorgement). Milk may also leak from your breasts. Ask your health care provider about ways to help relieve the discomfort.  If you are breastfeeding: ? Wear a bra that supports your breasts and fits well. Use breast pads to absorb milk that leaks. ? Keep your nipples clean and dry. Apply creams and ointments as told. ? You may have uterine contractions every time you breastfeed for up to several weeks after delivery. This helps your uterus return to its normal size. ? If you have any problems with breastfeeding, notify your health care provider or lactation consultant.  If you are not breastfeeding: ? Avoid touching your breasts. Do not squeeze out (express) milk. Doing this can make your breasts produce more milk. ? Wear a good-fitting bra and use cold packs  to help with swelling. °Intimacy and sexuality °· Ask your health care provider when you can engage in sexual activity. This may depend upon: °? Your risk of infection. °? How fast you are healing. °? Your comfort and desire to engage in sexual activity. °· You are able to get pregnant after delivery, even if you have not had your period. Talk with your health care provider about methods of birth control (contraception) or family planning if you desire  future pregnancies. °Medicines °· Take over-the-counter and prescription medicines only as told by your health care provider. °· Take an over-the-counter stool softener to help ease bowel movements as told by your health care provider. °· If you were prescribed an antibiotic medicine, take it as told by your health care provider. Do not stop taking the antibiotic even if you start to feel better. °· Review all previous and current prescriptions to check for possible transfer into breast milk. °Activity °· Gradually return to your normal activities as told by your health care provider. °· Rest as much as possible. Nap while your baby is sleeping. °Eating and drinking °· Drink enough fluid to keep your urine pale yellow. °· To help prevent or relieve constipation, eat high-fiber foods every day. °· Choose healthy eating to support breastfeeding or weight loss goals. °· Take your prenatal vitamins until your health care provider tells you to stop.   °General tips/recommendations °· Do not use any products that contain nicotine or tobacco. These products include cigarettes, chewing tobacco, and vaping devices, such as e-cigarettes. If you need help quitting, ask your health care provider. °· Do not drink alcohol, especially if you are breastfeeding. °· Do not take medications or drugs that are not prescribed to you, especially if you are breastfeeding. °· Visit your health care provider for a postpartum checkup within the first 3-6 weeks after delivery. °· Complete a comprehensive postpartum visit no later than 12 weeks after delivery. °· Keep all follow-up visits for you and your baby. °Contact a health care provider if: °· You feel unusually sad or worried. °· Your breasts become red, painful, or hard. °· You have a fever or other signs of an infection. °· You have bleeding that is soaking through one pad an hour or you have blood clots. °· You have a severe headache that doesn't go away or you have vision  changes. °· You have nausea and vomiting and are unable to eat or drink anything for 24 hours. °Get help right away if: °· You have chest pain or difficulty breathing. °· You have sudden, severe leg pain. °· You faint or have a seizure. °· You have thoughts about hurting yourself or your baby. °If you ever feel like you may hurt yourself or others, or have thoughts about taking your own life, get help right away. Go to your nearest emergency department or: °· Call your local emergency services (911 in the [place] °· The National Suicide Prevention Lifeline at 1-800-273-8255. This suicide crisis helpline is open 24 hours a day. °· Text the Crisis Text Line at 741741 (in the [place] °Summary °· The period of time after you deliver your newborn up to 6-12 weeks after delivery is called the postpartum period. °· Keep all follow-up visits for you and your baby. °· Review all previous and current prescriptions to check for possible transfer into breast milk. °· Contact a health care provider if you feel unusually sad or worried during the postpartum period. °This information is not intended   to replace advice given to you by your health care provider. Make sure you discuss any questions you have with your health care provider. °Document Revised: 07/27/2020 Document Reviewed: 07/27/2020 °Elsevier Patient Education © 2021 [employer] ° °

## 2021-01-02 ENCOUNTER — Encounter: Payer: Medicaid Other | Admitting: Advanced Practice Midwife

## 2021-01-07 ENCOUNTER — Inpatient Hospital Stay (HOSPITAL_COMMUNITY): Payer: Medicaid Other

## 2021-01-07 ENCOUNTER — Inpatient Hospital Stay (HOSPITAL_COMMUNITY)
Admission: AD | Admit: 2021-01-07 | Payer: Medicaid Other | Source: Home / Self Care | Admitting: Obstetrics and Gynecology

## 2021-01-23 ENCOUNTER — Ambulatory Visit: Payer: Medicaid Other | Admitting: Advanced Practice Midwife

## 2021-01-30 ENCOUNTER — Encounter: Payer: Self-pay | Admitting: Advanced Practice Midwife

## 2021-01-30 ENCOUNTER — Ambulatory Visit (INDEPENDENT_AMBULATORY_CARE_PROVIDER_SITE_OTHER): Payer: Medicaid Other | Admitting: Advanced Practice Midwife

## 2021-01-30 ENCOUNTER — Other Ambulatory Visit: Payer: Self-pay

## 2021-01-30 VITALS — BP 116/71 | Ht 59.0 in | Wt 168.0 lb

## 2021-01-30 DIAGNOSIS — Z3044 Encounter for surveillance of vaginal ring hormonal contraceptive device: Secondary | ICD-10-CM | POA: Diagnosis not present

## 2021-01-30 LAB — POCT URINE PREGNANCY: Preg Test, Ur: NEGATIVE

## 2021-01-30 MED ORDER — ETONOGESTREL-ETHINYL ESTRADIOL 0.12-0.015 MG/24HR VA RING: VAGINAL_RING | VAGINAL | 12 refills | Status: DC

## 2021-01-30 NOTE — Patient Instructions (Addendum)
Etonogestrel; Ethinyl Estradiol Vaginal Ring What is this medicine? ETONOGESTREL; ETHINYL ESTRADIOL (et oh noe JES trel; ETH in il es tra DYE ole) vaginal ring is a flexible, vaginal ring used as a contraceptive (birth control method). This product combines two types of female hormones, an estrogen and a progestin. It is used to prevent ovulation and pregnancy. Each ring is effective for 1 month. This medicine may be used for other purposes; ask your health care provider or pharmacist if you have questions. COMMON BRAND NAME(S): EluRyng, NuvaRing What should I tell my health care provider before I take this medicine? They need to know if you have any of these conditions: abnormal vaginal bleeding blood vessel disease or blood clots breast, cervical, endometrial, ovarian, liver, or uterine cancer diabetes gallbladder disease having surgery heart disease or recent heart attack high blood pressure high cholesterol or triglycerides history of irregular heartbeat or heart valve problems kidney disease liver disease migraine headaches protein C deficiency protein S deficiency recently had a baby, miscarriage, or abortion stroke systemic lupus erythematosus (SLE) tobacco smoker your age is more than 22 years old an unusual or allergic reaction to estrogens, progestins, other medicines, foods, dyes, or preservatives pregnant or trying to get pregnant breast-feeding How should I use this medicine? Insert the ring into your vagina as directed. Follow the directions on the prescription label. The ring will remain place for 3 weeks and is then removed for a 1-week break. A new ring is inserted 1 week after the last ring was removed, on the same day of the week. Check often to make sure the ring is still in place. If the ring was out of the vagina for an unknown amount of time, you may not be protected from pregnancy. Perform a pregnancy test and call your doctor. Do not use more often than  directed. A patient package insert for the product will be given with each prescription and refill. Read this sheet carefully each time. The sheet may change frequently. Contact your pediatrician regarding the use of this medicine in children. Special care may be needed. Overdosage: If you think you have taken too much of this medicine contact a poison control center or emergency room at once. NOTE: This medicine is only for you. Do not share this medicine with others. What if I miss a dose? You will need to use the ring exactly as directed. It is very important to follow the schedule every cycle. If you do not use the ring as directed, you may not be protected from pregnancy. If the ring should slip out, is lost, or if you leave it in longer or shorter than you should, contact your health care professional for advice. What may interact with this medicine? Do not take this medicine with the following medications: dasabuvir; ombitasvir; paritaprevir; ritonavir ombitasvir; paritaprevir; ritonavir vaginal lubricants or other vaginal products that are oil-based or silicone-based This medicine may also interact with the following medications: acetaminophen antibiotics or medicines for infections, especially rifampin, rifabutin, rifapentine, and griseofulvin, and possibly penicillins or tetracyclines aprepitant or fosaprepitant armodafinil ascorbic acid (vitamin C) barbiturate medicines, such as phenobarbital or primidone bosentan certain antiviral medicines for hepatitis, HIV or AIDS certain medicines for cancer treatment certain medicines for seizures like carbamazepine, clobazam, felbamate, lamotrigine, oxcarbazepine, phenytoin, rufinamide, topiramate certain medicines for treating high cholesterol cyclosporine dantrolene elagolix flibanserin grapefruit juice lesinurad medicines for diabetes medicines to treat fungal infections, such as griseofulvin, miconazole, fluconazole, ketoconazole,  itraconazole, posaconazole or voriconazole mifepristone mitotane modafinil  morphine mycophenolate St. John's wort tamoxifen temazepam theophylline or aminophylline thyroid hormones tizanidine tranexamic acid ulipristal warfarin This list may not describe all possible interactions. Give your health care provider a list of all the medicines, herbs, non-prescription drugs, or dietary supplements you use. Also tell them if you smoke, drink alcohol, or use illegal drugs. Some items may interact with your medicine. What should I watch for while using this medicine? Visit your doctor or health care professional for regular checks on your progress. You will need a regular breast and pelvic exam and Pap smear while on this medicine. Check with your doctor or health care professional to see if you need an additional method of contraception during the first cycle that you use this ring. Female condoms (made with natural rubber latex, polyisoprene, and polyurethane) and spermicides may be used. Do not use a diaphragm, cervical cap, or a female condom, as the ring can interfere with these birth control methods and their proper placement. If you have any reason to think you are pregnant, stop using this medicine right away and contact your doctor or health care professional. If you are using this medicine for hormone related problems, it may take several cycles of use to see improvement in your condition. Smoking increases the risk of getting a blood clot or having a stroke while you are using hormonal birth control, especially if you are more than 22 years old. You are strongly advised not to smoke. Some women are prone to getting dark patches on the skin of the face (cholasma). Your risk of getting chloasma with this medicine is higher if you had chloasma during a pregnancy. Keep out of the sun. If you cannot avoid being in the sun, wear protective clothing and use sunscreen. Do not use sun lamps or tanning  beds/booths. This medicine can make your body retain fluid, making your fingers, hands, or ankles swell. Your blood pressure can go up. Contact your doctor or health care professional if you feel you are retaining fluid. If you are going to have elective surgery, you may need to stop using this medicine before the surgery. Consult your health care professional for advice. This medicine does not protect you against HIV infection (AIDS) or any other sexually transmitted diseases. What side effects may I notice from receiving this medicine? Side effects that you should report to your doctor or health care professional as soon as possible: allergic reactions such as skin rash or itching, hives, swelling of the lips, mouth, tongue, or throat depression high blood pressure migraines or severe, sudden headaches signs and symptoms of a blood clot such as breathing problems; changes in vision; chest pain; severe, sudden headache; pain, swelling, warmth in the leg; trouble speaking; sudden numbness or weakness of the face, arm or leg signs and symptoms of infection like fever or chills with dizziness and a sunburn-like rash, or pain or trouble passing urine stomach pain symptoms of vaginal infection like itching, irritation or unusual discharge yellowing of the eyes or skin Side effects that usually do not require medical attention (report these to your doctor or health care professional if they continue or are bothersome): acne breast pain, tenderness irregular vaginal bleeding or spotting, particularly during the first month of use mild headache nausea painful periods vomiting This list may not describe all possible side effects. Call your doctor for medical advice about side effects. You may report side effects to FDA at 1-800-FDA-1088. Where should I keep my medicine? Keep out of the  reach of children. Store unopened medicine for up to 4 months at room temperature at 15 and 30 degrees C (59 and  86 degrees F). Protect from light. Do not store above 30 degrees C (86 degrees F). Throw away any unused medicine 4 months after the dispense date or the expiration date, whichever comes first. A ring may only be used for 1 cycle (1 month). After the 3-week cycle, a used ring is removed and should be placed in the re-closable foil pouch and discarded in the trash out of reach of children and pets. Do NOT flush down the toilet. NOTE: This sheet is a summary. It may not cover all possible information. If you have questions about this medicine, talk to your doctor, pharmacist, or health care provider.  2021 Elsevier/Gold Standard (2019-09-29 43:15:40)     Preventive Care 92-18 Years Old, Female Preventive care refers to lifestyle choices and visits with your health care provider that can promote health and wellness. This includes:  A yearly physical exam. This is also called an annual wellness visit.  Regular dental and eye exams.  Immunizations.  Screening for certain conditions.  Healthy lifestyle choices, such as: ? Eating a healthy diet. ? Getting regular exercise. ? Not using drugs or products that contain nicotine and tobacco. ? Limiting alcohol use. What can I expect for my preventive care visit? Physical exam Your health care provider may check your:  Height and weight. These may be used to calculate your BMI (body mass index). BMI is a measurement that tells if you are at a healthy weight.  Heart rate and blood pressure.  Body temperature.  Skin for abnormal spots. Counseling Your health care provider may ask you questions about your:  Past medical problems.  Family's medical history.  Alcohol, tobacco, and drug use.  Emotional well-being.  Home life and relationship well-being.  Sexual activity.  Diet, exercise, and sleep habits.  Work and work Statistician.  Access to firearms.  Method of birth control.  Menstrual cycle.  Pregnancy history. What  immunizations do I need? Vaccines are usually given at various ages, according to a schedule. Your health care provider will recommend vaccines for you based on your age, medical history, and lifestyle or other factors, such as travel or where you work.   What tests do I need? Blood tests  Lipid and cholesterol levels. These may be checked every 5 years starting at age 17.  Hepatitis C test.  Hepatitis B test. Screening  Diabetes screening. This is done by checking your blood sugar (glucose) after you have not eaten for a while (fasting).  STD (sexually transmitted disease) testing, if you are at risk.  BRCA-related cancer screening. This may be done if you have a family history of breast, ovarian, tubal, or peritoneal cancers.  Pelvic exam and Pap test. This may be done every 3 years starting at age 76. Starting at age 59, this may be done every 5 years if you have a Pap test in combination with an HPV test. Talk with your health care provider about your test results, treatment options, and if necessary, the need for more tests.   Follow these instructions at home: Eating and drinking  Eat a healthy diet that includes fresh fruits and vegetables, whole grains, lean protein, and low-fat dairy products.  Take vitamin and mineral supplements as recommended by your health care provider.  Do not drink alcohol if: ? Your health care provider tells you not to drink. ? You  are pregnant, may be pregnant, or are planning to become pregnant.  If you drink alcohol: ? Limit how much you have to 0-1 drink a day. ? Be aware of how much alcohol is in your drink. In the [place] one drink equals one 12 oz bottle of beer (355 mL), one 5 oz glass of wine (148 mL), or one 1 oz glass of hard liquor (44 mL).   Lifestyle  Take daily care of your teeth and gums. Brush your teeth every morning and night with fluoride toothpaste. Floss one time each day.  Stay active. Exercise for at least 30 minutes 5 or  more days each week.  Do not use any products that contain nicotine or tobacco, such as cigarettes, e-cigarettes, and chewing tobacco. If you need help quitting, ask your health care provider.  Do not use drugs.  If you are sexually active, practice safe sex. Use a condom or other form of protection to prevent STIs (sexually transmitted infections).  If you do not wish to become pregnant, use a form of birth control. If you plan to become pregnant, see your health care provider for a prepregnancy visit.  Find healthy ways to cope with stress, such as: ? Meditation, yoga, or listening to music. ? Journaling. ? Talking to a trusted person. ? Spending time with friends and family. Safety  Always wear your seat belt while driving or riding in a vehicle.  Do not drive: ? If you have been drinking alcohol. Do not ride with someone who has been drinking. ? When you are tired or distracted. ? While texting.  Wear a helmet and other protective equipment during sports activities.  If you have firearms in your house, make sure you follow all gun safety procedures.  Seek help if you have been physically or sexually abused. What's next?  Go to your health care provider once a year for an annual wellness visit.  Ask your health care provider how often you should have your eyes and teeth checked.  Stay up to date on all vaccines. This information is not intended to replace advice given to you by your health care provider. Make sure you discuss any questions you have with your health care provider. Document Revised: 07/09/2020 Document Reviewed: 07/23/2018 Elsevier Patient Education  2021 [employer].

## 2021-01-30 NOTE — Progress Notes (Signed)
° ° °  Post Partum Visit Note  Kristie Mcintosh is a 20 y.o. G15P2002 female who presents for a postpartum visit. She is 6 weeks postpartum following a normal spontaneous vaginal delivery.  I have fully reviewed the prenatal and intrapartum course. The delivery was at 38.2 gestational weeks.  Anesthesia: epidural. Postpartum course has been unremarkable. Baby is doing well. Baby is feeding by both breast and bottle - Similac Advance. Bleeding no bleeding. Bowel function is normal. Bladder function is normal. Patient is not sexually active. Contraception method is nothing currently, would like to use Nuvaring. Postpartum depression screening: negative. Score: 0   The pregnancy intention screening data noted above was reviewed. Potential methods of contraception were discussed. The patient elected to proceed with Vaginal Ring.    The following portions of the patient's history were reviewed and updated as appropriate: allergies, current medications, past family history, past medical history, past social history, past surgical history and problem list.  Review of Systems A comprehensive review of systems was negative.    Objective:  LMP  (LMP Unknown)    General:  alert, cooperative, appears stated age and no distress   Breasts:  negative  Lungs: No problems with respiration noted  Heart:  regular rate and rhythm, S1, S2 normal, no murmur, click, rub or gallop  Abdomen: soft, non-tender; bowel sounds normal; no masses,  no organomegaly   Vulva:  not evaluated  Vagina: not evaluated        Assessment:    Normal postpartum exam. Pap smear not done at today's visit.   Plan:   Essential components of care per ACOG recommendations:  1.  Mood and well being: Patient with negative depression screening today. Reviewed local resources for support.  - Patient does not use tobacco.  - hx of drug use? No   2. Infant care and feeding:  -Patient currently breastmilk feeding? Yes (breast and bottle).  If breastmilk feeding discussed return to work and pumping. If needed, patient was provided letter for work to allow for every 2-3 hr pumping breaks, and to be granted a private location to express breastmilk and refrigerated area to store breastmilk. Reviewed importance of draining breast regularly to support lactation. -Social determinants of health (SDOH) reviewed in EPIC. No concerns  3. Sexuality, contraception and birth spacing - Patient does not want a pregnancy in the next year.  Her partner is currently incarcerated and she is not sexually active.  - Patient declined review of contraception options, previously verbalized intention to pursue Nexplanon but requested Nuvaring throughout visit. CNM encouraged consideration of LARCs - Discussed birth spacing of 18 months  4. Sleep and fatigue -Encouraged family/partner/community support of 4 hrs of uninterrupted sleep to help with mood and fatigue  5. Physical Recovery  - Discussed patients delivery and complications - Reviewed perineal healing reviewed. Patient expressed understanding - Patient has urinary incontinence? No - Patient is safe to resume physical and sexual activity  6.  Health Maintenance - Last pap smear done 06/22/2020 and was normal with negative HPV.   Clayton Bibles, MSN, CNM Certified Nurse Midwife, Biochemist, clinical for [employer], [facility]

## 2021-02-13 ENCOUNTER — Other Ambulatory Visit: Payer: Self-pay

## 2021-02-13 ENCOUNTER — Encounter: Payer: Self-pay | Admitting: Obstetrics and Gynecology

## 2021-02-13 ENCOUNTER — Ambulatory Visit (INDEPENDENT_AMBULATORY_CARE_PROVIDER_SITE_OTHER): Payer: Medicaid Other | Admitting: Obstetrics and Gynecology

## 2021-02-13 DIAGNOSIS — Z5941 Food insecurity: Secondary | ICD-10-CM

## 2021-02-13 NOTE — Progress Notes (Signed)
Obstetrics/Postpartum Visit  Appointment Date: 02/13/2021  OBGYN Clinic: [facility]  Primary Care Provider: [facility]  Chief Complaint:  Chief Complaint  Patient presents with   Postpartum Care    History of Present Illness: Kristie Mcintosh is a 20 y.o. African-American [id number] (No LMP recorded.), seen for the above chief complaint. Her past medical history is significant for obesity, COVID in 3rd trimester.   She is s/p spontaneous vaginal delivery on 12/26/20 at 38 weeks; she was discharged to home on PPD#2. Pregnancy complicated by obesity, Covid in 3rd trimester, GBS+.  Complains of nothing, she is feeling well. Not getting a lot of sleep, FOB is incarcerated and will be getting out soon to help her with bills.  Vaginal bleeding or discharge: Yes  Breast or formula feeding: both Intercourse: No  Contraception: none PP depression s/s: No  Any bowel or bladder issues: No  Pap smear: no abnormalities (date: 05/2020)  Review of Systems: Positive for n/a.   Her 12 point review of systems is negative or as noted in the History of Present Illness.  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 had no medications administered during this visit. Current Outpatient Medications  Medication Sig Dispense Refill   etonogestrel-ethinyl estradiol (NUVARING) 0.12-0.015 MG/24HR vaginal ring Insert vaginally and leave in place for 3 consecutive weeks, then remove for 1 week. 1  each 12   No current facility-administered medications for this visit.    Allergies Eggs or egg-derived products, Peanuts [peanut oil], Penicillin g, and Promethazine  Physical Exam:  BP 133/84    Pulse 85    Wt 168 lb (76.2 kg)    BMI 33.93 kg/m  Body mass index is 33.93 kg/m. General appearance: Well nourished, well developed female in no acute distress.  Cardiovascular: regular rate and rhythm Respiratory:  Normal respiratory effort Abdomen: no masses, hernias; diffusely non tender to palpation, non distended Breasts: not examined. Neuro/Psych:  Normal mood and affect.  Skin:  Warm and dry.   PP Depression Screening:    Edinburgh Postnatal Depression Scale - 02/13/21 1440      Edinburgh Postnatal Depression Scale:  In the Past 7 Days   I have been able to laugh and see the funny side of things. 0    I have looked forward with enjoyment to things. 0    I have blamed myself unnecessarily when things went wrong. 0    I have been anxious or worried for no good reason. 0    I have felt scared or panicky for no good reason. 0    Things have been getting on top of me. 0    I have been so unhappy that I have had difficulty sleeping. 0    I have felt sad or miserable. 0    I have been so unhappy that I have been crying. 0    The thought of harming myself has occurred to me. 0    Edinburgh Postnatal Depression Scale Total 0  Assessment: Patient is a 20 y.o. Y4M2500 who is 6 weeks post partum from a spontaneous vaginal delivery. She is doing well.   Plan:   1. Postpartum state Doing well, no issues Letter given to return to work  2. Food insecurity Accepts referral to LCSW   Essential components of care per ACOG recommendations:  1.  Mood and well being: Patient with negative depression screening today. Reviewed local resources for support.  - Patient does use tobacco. If using tobacco we discussed reduction and for recently cessation risk of relapse - hx of  drug use? No    2. Infant care and feeding:  -Patient currently breastmilk feeding? Yes She is planning to stop feeding and we discussed ways to decrease milk supply.. -Social determinants of health (SDOH) reviewed in EPIC. The following needs were identified, needs help with rent/food  3. Sexuality, contraception and birth spacing - Patient does not want a pregnancy in the next year.  Desired family size is unknown children.  - Reviewed forms of contraception in tiered fashion. Patient desired no method today.    4. Sleep and fatigue -Encouraged family/partner/community support of 4 hrs of uninterrupted sleep to help with mood and fatigue  5. Physical Recovery  - Discussed patients delivery and complications - Patient had no laceration, perineal healing reviewed. Patient expressed understanding - Patient has urinary incontinence? No  - Patient is safe to resume physical and sexual activity  6.  Health Maintenance - Last pap smear done 05/2020 and was normal with negative HPV.  RTC    K. Therese Sarah, MD, [facility] Attending Center for [employer] [facility])

## 2021-07-09 ENCOUNTER — Ambulatory Visit (INDEPENDENT_AMBULATORY_CARE_PROVIDER_SITE_OTHER): Payer: Medicaid Other

## 2021-07-09 ENCOUNTER — Other Ambulatory Visit: Payer: Self-pay

## 2021-07-09 DIAGNOSIS — N926 Irregular menstruation, unspecified: Secondary | ICD-10-CM | POA: Diagnosis not present

## 2021-07-09 LAB — POCT URINE PREGNANCY: Preg Test, Ur: NEGATIVE

## 2021-07-09 NOTE — Progress Notes (Signed)
Kristie Mcintosh presents today for UPT. She has no unusual complaints. LMP: 06/01/2021    OBJECTIVE: Appears well, in no apparent distress.  OB History     Gravida  2   Para  2   Term  2   Preterm  0   AB  0   Living  2      SAB  0   IAB  0   Ectopic  0   Multiple  0   Live Births  2          Home UPT Result: NEGATIVE In-Office UPT result:NEGATIVE I have reviewed the patient's medical, obstetrical, social, and family histories, and medications.   ASSESSMENT: Positive pregnancy test  PLAN Prenatal care to be completed at: Not pregnant

## 2021-08-04 ENCOUNTER — Inpatient Hospital Stay (HOSPITAL_COMMUNITY)
Admission: AD | Admit: 2021-08-04 | Discharge: 2021-08-04 | Discharge status: Against Medical Advice | Payer: Medicaid Other | Attending: Family Medicine | Admitting: Family Medicine

## 2021-08-04 ENCOUNTER — Other Ambulatory Visit: Payer: Self-pay

## 2021-08-04 NOTE — Progress Notes (Signed)
Admitting brought the AMA paper to MAU desk, patient signed AMA form and left without being seen.

## 2021-10-06 ENCOUNTER — Emergency Department (HOSPITAL_BASED_OUTPATIENT_CLINIC_OR_DEPARTMENT_OTHER)
Admission: EM | Admit: 2021-10-06 | Discharge: 2021-10-07 | Disposition: A | Discharge status: Home / Self Care | Payer: Medicaid Other | Attending: Emergency Medicine | Admitting: Emergency Medicine

## 2021-10-06 DIAGNOSIS — J45909 Unspecified asthma, uncomplicated: Secondary | ICD-10-CM | POA: Diagnosis not present

## 2021-10-06 DIAGNOSIS — R102 Pelvic and perineal pain: Secondary | ICD-10-CM

## 2021-10-06 DIAGNOSIS — Z87891 Personal history of nicotine dependence: Secondary | ICD-10-CM | POA: Diagnosis not present

## 2021-10-06 DIAGNOSIS — R1031 Right lower quadrant pain: Secondary | ICD-10-CM | POA: Insufficient documentation

## 2021-10-06 DIAGNOSIS — Z91010 Allergy to peanuts: Secondary | ICD-10-CM | POA: Insufficient documentation

## 2021-10-06 NOTE — ED Triage Notes (Signed)
RLQ pain which began this pm.  Pt was at work and had to use the bathroom and found that there was "lots of bright red blood" which came out of her rectum along with some slime.  Pt arrives in obvious distress.  LMP last month.  Not on any birthcontrol.  Does not know when LBM was

## 2021-10-07 ENCOUNTER — Other Ambulatory Visit: Payer: Self-pay

## 2021-10-07 ENCOUNTER — Encounter (HOSPITAL_BASED_OUTPATIENT_CLINIC_OR_DEPARTMENT_OTHER): Payer: Self-pay | Admitting: *Deleted

## 2021-10-07 ENCOUNTER — Emergency Department (HOSPITAL_BASED_OUTPATIENT_CLINIC_OR_DEPARTMENT_OTHER): Payer: Medicaid Other

## 2021-10-07 ENCOUNTER — Emergency Department (HOSPITAL_COMMUNITY): Payer: Medicaid Other

## 2021-10-07 LAB — CBC WITH DIFFERENTIAL/PLATELET
Abs Immature Granulocytes: 0.03 10*3/uL (ref 0.00–0.07)
Basophils Absolute: 0 10*3/uL (ref 0.0–0.1)
Basophils Relative: 0 %
Eosinophils Absolute: 0.4 10*3/uL (ref 0.0–0.5)
Eosinophils Relative: 4 %
HCT: 40.5 % (ref 36.0–46.0)
Hemoglobin: 13 g/dL (ref 12.0–15.0)
Immature Granulocytes: 0 %
Lymphocytes Relative: 33 %
Lymphs Abs: 3.3 10*3/uL (ref 0.7–4.0)
MCH: 28.4 pg (ref 26.0–34.0)
MCHC: 32.1 g/dL (ref 30.0–36.0)
MCV: 88.6 fL (ref 80.0–100.0)
Monocytes Absolute: 0.8 10*3/uL (ref 0.1–1.0)
Monocytes Relative: 8 %
Neutro Abs: 5.5 10*3/uL (ref 1.7–7.7)
Neutrophils Relative %: 55 %
Platelets: 191 10*3/uL (ref 150–400)
RBC: 4.57 MIL/uL (ref 3.87–5.11)
RDW: 12.5 % (ref 11.5–15.5)
WBC: 10 10*3/uL (ref 4.0–10.5)
nRBC: 0 % (ref 0.0–0.2)

## 2021-10-07 LAB — COMPREHENSIVE METABOLIC PANEL
ALT: 11 U/L (ref 0–44)
AST: 19 U/L (ref 15–41)
Albumin: 4.3 g/dL (ref 3.5–5.0)
Alkaline Phosphatase: 75 U/L (ref 38–126)
Anion gap: 8 (ref 5–15)
BUN: 9 mg/dL (ref 6–20)
CO2: 26 mmol/L (ref 22–32)
Calcium: 9.3 mg/dL (ref 8.9–10.3)
Chloride: 102 mmol/L (ref 98–111)
Creatinine, Ser: 0.85 mg/dL (ref 0.44–1.00)
GFR, Estimated: >60 mL/min (ref >60)
Glucose, Bld: 94 mg/dL (ref 70–99)
Potassium: 4.4 mmol/L (ref 3.5–5.1)
Sodium: 136 mmol/L (ref 135–145)
Total Bilirubin: 0.4 mg/dL (ref 0.3–1.2)
Total Protein: 7.6 g/dL (ref 6.5–8.1)

## 2021-10-07 LAB — WET PREP, GENITAL
Clue Cells Wet Prep HPF POC: NONE SEEN
Sperm: NONE SEEN
Trich, Wet Prep: NONE SEEN
WBC, Wet Prep HPF POC: NONE SEEN
Yeast Wet Prep HPF POC: NONE SEEN

## 2021-10-07 LAB — LIPASE, BLOOD: Lipase: 24 U/L (ref 11–51)

## 2021-10-07 LAB — HCG, SERUM, QUALITATIVE: Preg, Serum: NEGATIVE

## 2021-10-07 MED ORDER — ONDANSETRON HCL 4 MG/2ML IJ SOLN
4.0000 mg | Freq: Once | INTRAMUSCULAR | Status: AC
  Administered 2021-10-07: 4 mg via INTRAVENOUS

## 2021-10-07 MED ORDER — LACTATED RINGERS IV BOLUS
1000.0000 mL | Freq: Once | INTRAVENOUS | Status: AC
  Administered 2021-10-07: 1000 mL via INTRAVENOUS

## 2021-10-07 MED ORDER — DIPHENHYDRAMINE HCL 50 MG/ML IJ SOLN
12.5000 mg | Freq: Once | INTRAMUSCULAR | Status: AC
  Administered 2021-10-07: 12.5 mg via INTRAVENOUS

## 2021-10-07 MED ORDER — HYDROMORPHONE HCL 1 MG/ML IJ SOLN
0.5000 mg | Freq: Once | INTRAMUSCULAR | Status: AC
  Administered 2021-10-07: 0.5 mg via INTRAVENOUS
  Filled 2021-10-07: qty 1

## 2021-10-07 MED ORDER — HYDROMORPHONE HCL 1 MG/ML IJ SOLN
1.0000 mg | Freq: Once | INTRAMUSCULAR | Status: AC
Start: 2021-10-07 — End: 2021-10-07

## 2021-10-07 MED ORDER — IOHEXOL 300 MG/ML  SOLN
100.0000 mL | Freq: Once | INTRAMUSCULAR | Status: AC | PRN
  Administered 2021-10-07: 100 mL via INTRAVENOUS

## 2021-10-07 MED ORDER — ONDANSETRON HCL 4 MG/2ML IJ SOLN
INTRAMUSCULAR | Status: AC
  Filled 2021-10-07: qty 2

## 2021-10-07 MED ORDER — DIPHENHYDRAMINE HCL 50 MG/ML IJ SOLN
INTRAMUSCULAR | Status: AC
  Filled 2021-10-07: qty 1

## 2021-10-07 MED ORDER — HYDROMORPHONE HCL 1 MG/ML IJ SOLN
INTRAMUSCULAR | Status: AC
  Administered 2021-10-07: 1 mg via INTRAVENOUS
  Filled 2021-10-07: qty 1

## 2021-10-07 NOTE — ED Provider Notes (Signed)
[facility] EMERGENCY DEPT Provider Note   CSN: [id number] Arrival date & time: 10/06/21  2352     History Chief Complaint  Patient presents with   Abdominal Pain    Kristie Mcintosh is a 20 y.o. female.  20 year old female who presents emerged from today with right lower quadrant abdominal pain.  Patient is difficulty get history from secondary to the amount of pain she is having.  She has right lower quadrant pain.  She states that she was feeling well at work and then she went to go to the bathroom.  She had an episode where she had just bright red blood in mucus in her stool and then shortly after that she started having severe right lower quadrant pain and presents here for further evaluation.  Had not been sick prior to this.  No fevers.  No vaginal symptoms.  Her boyfriend states that her period is probably a day or 2 late at this point.   Abdominal Pain     Past Medical History:  Diagnosis Date   Abdominal pain affecting pregnancy 05/16/2017   Amenorrhea 05/16/2017   Asthma    Bacterial vaginitis 05/16/2017   Headache(784.0)    Normal labor 12/26/2020   Obesity     Patient Active Problem List   Diagnosis Date Noted   Vaginal delivery 12/26/2020   Group B Streptococcus carrier, +RV culture, currently pregnant 12/16/2020   Nausea and vomiting during pregnancy 06/22/2020   Tension headache 08/11/2013   Migraine without aura and without status migrainosus, not intractable 08/11/2013   Insomnia 08/11/2013   Circadian rhythm sleep disorder, 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   ADD (attention deficit disorder) 08/11/2013   Obesity (BMI 30-39.9) 08/11/2013    Past Surgical History:  Procedure Laterality Date   ADENOIDECTOMY AND MYRINGOTOMY WITH TUBE PLACEMENT Bilateral 2001   WISDOM TOOTH EXTRACTION Bilateral      OB History     Gravida  2   Para  2   Term  2   Preterm  0   AB  0   Living  2      SAB  0   IAB  0   Ectopic  0    Multiple  0   Live Births  2           Family History  Problem Relation Age of Onset   Headache Mother    Heart disease Mother    Migraines Maternal Grandmother    Depression Maternal Grandmother    Heart Problems Maternal Grandfather    Bipolar disorder Paternal Aunt    Autism Cousin        2 Maternal 1st Cousins have Autism   Hyperlipidemia Other    Diabetes Other    Heart disease Other    Hypertension Other     Social History   Tobacco Use   Smoking status: Former    Packs/day: 0.25    Types: Cigarettes    Quit date: 04/25/2017    Years since quitting: 4.4   Smokeless tobacco: Never  Vaping Use   Vaping Use: Never used  Substance Use Topics   Alcohol use: No   Drug use: Not Currently    Home Medications Prior to Admission medications   Medication Sig Start Date End Date Taking? Authorizing Provider  etonogestrel-ethinyl estradiol (NUVARING) 0.12-0.015 MG/24HR vaginal ring Insert vaginally and leave in place for 3 consecutive weeks, then remove for 1 week. 01/30/21   Calvert Cantor, CNM  dicyclomine (BENTYL) 20 MG tablet Take 1 tablet (20 mg total) by mouth 2 (two) times daily. 10/04/19 04/05/20  Dartha Lodge, PA-C  famotidine (PEPCID) 20 MG tablet Take 1 tablet (20 mg total) by mouth 2 (two) times daily. 10/04/19 04/05/20  Dartha Lodge, PA-C    Allergies    Eggs or egg-derived products, Peanuts [peanut oil], Penicillin g, and Promethazine  Review of Systems   Review of Systems  Unable to perform ROS: Other  Gastrointestinal:  Positive for abdominal pain.   Physical Exam Updated Vital Signs BP 128/90 (BP Location: Right Arm)   Pulse 69   Temp 98.5 F (36.9 C) (Oral)   Resp (!) 22   SpO2 95%   Physical Exam Vitals and nursing note reviewed.  Constitutional:      General: She is in acute distress (Likely secondary to pain).     Appearance: She is well-developed. She is diaphoretic.  HENT:     Head: Normocephalic and atraumatic.   Cardiovascular:     Rate and Rhythm: Normal rate and regular rhythm.  Pulmonary:     Effort: No respiratory distress.     Breath sounds: No stridor.  Abdominal:     General: There is no distension.     Tenderness: There is abdominal tenderness in the right lower quadrant.  Musculoskeletal:     Cervical back: Normal range of motion.  Neurological:     Mental Status: She is alert.    ED Results / Procedures / Treatments   Labs (all labs ordered are listed, but only abnormal results are displayed) Labs Reviewed  LIPASE, BLOOD  COMPREHENSIVE METABOLIC PANEL  URINALYSIS, ROUTINE W REFLEX MICROSCOPIC  PREGNANCY, URINE  CBC WITH DIFFERENTIAL/PLATELET    EKG None  Radiology No results found.  Procedures Procedures   Medications Ordered in ED Medications  lactated ringers bolus 1,000 mL (has no administration in time range)  HYDROmorphone (DILAUDID) injection 1 mg (has no administration in time range)    ED Course  I have reviewed the triage vital signs and the nursing notes.  Pertinent labs & imaging results that were available during my care of the patient were reviewed by me and considered in my medical decision making (see chart for details).    MDM Rules/Calculators/A&P                         Ruptured appendix versus appendicitis versus ectopic pregnancy versus inflammatory bowel disease.  We will start with pain meds, fluids, nausea meds and CT scan.   After pain meds, patient is much more calm.  She states that her period was actually the end of the month.  She is not late on her period.  She denies any vaginal discharge.  She denies any history of this similar issue.  She does relay that she has had rectal bleeding quite a while now and probably since she had her baby.  She been diagnosed with hemorrhoids in the past and is likely the source of this bleeding.  Pelvic exam shows some mild tenderness on the right but no asymmetric masses.  With her initial  presentation and still concern for some significant pathology.  We will proceed with ultrasound which cannot be done here.  We will transfer to [facility].  Discussed with Dr. Preston Fleeting and also the patient and charge nurse at [facility] and they are all okay with this plan.  Patient will be discharged travel POV there  for the ultrasound.   Final Clinical Impression(s) / ED Diagnoses Final diagnoses:  None    Rx / DC Orders ED Discharge Orders     None        Audine Mangione, Barbara Cower, MD 10/07/21 [phone]

## 2021-10-07 NOTE — ED Notes (Signed)
Pt initially declined benadryl for itching after pain medication but CT tech states that she can not take her for CT while she is having itching, EDP notified and pt agrees to take benadryl which was given.  Await CT scan.

## 2021-10-07 NOTE — ED Notes (Signed)
Pt had her child on 12/26/2020 and has had intermittent rectal bleeding since but no RLQ pain with this.

## 2021-10-07 NOTE — ED Notes (Signed)
Pt stated she was leaving AMA 

## 2021-10-07 NOTE — ED Notes (Signed)
Pt vomited clear light green liquid (approximately 200cc) emptied emesis bag and given cool washcloth.  Pt in severe pain but calmer after medication.  Pt advised that urine sample is needed but pt unable to go at this time.

## 2021-10-08 LAB — GC/CHLAMYDIA PROBE AMP (~~LOC~~) NOT AT ARMC
Chlamydia: NEGATIVE
Comment: NEGATIVE
Comment: NORMAL
Neisseria Gonorrhea: NEGATIVE

## 2021-10-31 ENCOUNTER — Ambulatory Visit: Payer: Medicaid Other

## 2021-11-01 ENCOUNTER — Ambulatory Visit: Payer: Medicaid Other | Admitting: Obstetrics

## 2021-11-23 ENCOUNTER — Ambulatory Visit: Payer: Medicaid Other | Admitting: Obstetrics & Gynecology

## 2022-01-14 ENCOUNTER — Other Ambulatory Visit: Payer: Self-pay

## 2022-01-14 ENCOUNTER — Inpatient Hospital Stay (HOSPITAL_COMMUNITY)
Admission: AD | Admit: 2022-01-14 | Discharge: 2022-01-14 | Disposition: A | Discharge status: Home / Self Care | Payer: Medicaid Other | Attending: Obstetrics and Gynecology | Admitting: Obstetrics and Gynecology

## 2022-01-14 DIAGNOSIS — Z3202 Encounter for pregnancy test, result negative: Secondary | ICD-10-CM | POA: Diagnosis not present

## 2022-01-14 LAB — POCT PREGNANCY, URINE: Preg Test, Ur: NEGATIVE

## 2022-01-14 LAB — HCG, QUANTITATIVE, PREGNANCY: hCG, Beta Chain, Quant, S: <1 m[IU]/mL (ref <5)

## 2022-01-14 NOTE — MAU Provider Note (Signed)
Event Date/Time   First Provider Initiated Contact with Patient 01/14/22 0049      S [name] is a 21 y.o. [id number] patient who presents to MAU today with complaint of abdominal cramping. Reports some mid lower abdominal cramping since last Tuesday. LMP was 1/13. States she is late for her period so took a pregnancy test yesterday morning that was positive. Denies vaginal bleeding.    O BP (!) (P) 111/56 (BP Location: Right Arm)    Pulse (P) 71    Resp (!) (P) 99    Ht (P) 4\' 11"  (1.499 m)    Wt (P) 79.8 kg    SpO2 (P) 100%    BMI (P) 35.53 kg/m  Physical Exam Vitals and nursing note reviewed.  Constitutional:      General: She is not in acute distress.    Appearance: Normal appearance.  Eyes:     General: No scleral icterus.    Conjunctiva/sclera: Conjunctivae normal.  Pulmonary:     Effort: Pulmonary effort is normal. No respiratory distress.  Neurological:     General: No focal deficit present.     Mental Status: She is alert.  Psychiatric:        Mood and Affect: Mood normal.        Behavior: Behavior normal.   Results for orders placed or performed during the hospital encounter of 01/14/22 (from the past 24 hour(s))  Pregnancy, urine POC     Status: None   Collection Time: 01/14/22 12:34 AM  Result Value Ref Range   Preg Test, Ur NEGATIVE NEGATIVE  hCG, quantitative, pregnancy     Status: None   Collection Time: 01/14/22  1:15 AM  Result Value Ref Range   hCG, Beta Chain, Quant, S <1 <5 mIU/mL    A Medical screening exam complete 1. Negative pregnancy test   -patient reports positive pregnancy test at home. Our urine pregnancy test is negative so proceeded with HCG which resulted at <1.    P Discharge from MAU in stable condition Reviewed results with patient Encouraged patient to follow up with [facility] since she would like to discuss contraception options  01/16/22, NP 01/14/2022 1:01 AM

## 2022-01-14 NOTE — MAU Note (Signed)
MAU Provider performed MSE on patient, and was discharged per provider after lab draw.

## 2022-01-14 NOTE — MAU Note (Signed)
Pt present to the MAU for an confirmation of pregnancy. Pt states she took a preg test at home and it was positive. POCT preg urine test was Neg. Pt reports having some bloody stool for a couple months. Pt also reports cramping and abd pain since Tuesday.

## 2022-02-26 ENCOUNTER — Encounter: Payer: Self-pay | Admitting: Obstetrics

## 2022-03-03 ENCOUNTER — Encounter (HOSPITAL_BASED_OUTPATIENT_CLINIC_OR_DEPARTMENT_OTHER): Payer: Self-pay

## 2022-03-03 ENCOUNTER — Emergency Department (HOSPITAL_BASED_OUTPATIENT_CLINIC_OR_DEPARTMENT_OTHER)
Admission: EM | Admit: 2022-03-03 | Discharge: 2022-03-03 | Disposition: A | Discharge status: Home / Self Care | Payer: Medicaid Other | Attending: Emergency Medicine | Admitting: Emergency Medicine

## 2022-03-03 ENCOUNTER — Other Ambulatory Visit: Payer: Self-pay

## 2022-03-03 DIAGNOSIS — Z91010 Allergy to peanuts: Secondary | ICD-10-CM | POA: Diagnosis not present

## 2022-03-03 DIAGNOSIS — K649 Unspecified hemorrhoids: Secondary | ICD-10-CM | POA: Diagnosis not present

## 2022-03-03 DIAGNOSIS — K625 Hemorrhage of anus and rectum: Secondary | ICD-10-CM | POA: Diagnosis present

## 2022-03-03 MED ORDER — POLYETHYLENE GLYCOL 3350 17 GM/SCOOP PO POWD: 17.0000 g | Freq: Every day | ORAL | 0 refills | Status: DC

## 2022-03-03 MED ORDER — WITCH HAZEL-GLYCERIN EX PADS: 1.0000 "application " | MEDICATED_PAD | CUTANEOUS | 12 refills | Status: DC | PRN

## 2022-03-03 MED ORDER — HYDROCORTISONE (PERIANAL) 2.5 % EX CREA: 1.0000 "application " | TOPICAL_CREAM | Freq: Two times a day (BID) | CUTANEOUS | 0 refills | Status: DC

## 2022-03-03 NOTE — ED Triage Notes (Signed)
POV, pt sts that she has had rectal bleeding since December, bleeding with stools is getting worse since feb, sts that she is bleeding without straining, been taking stool softeners, eating prunes, c/o lower bilateral abd pain, pt alert and oriented, amb to triage ?

## 2022-03-03 NOTE — ED Provider Notes (Signed)
? ?[facility] EMERGENCY DEPT  ?Provider Note ? ?CSN: [id number] ?Arrival date & time: 03/03/22 0233 ? ?History ?Chief Complaint  ?Patient presents with  ? Rectal Bleeding  ? ? ?Kristie Mcintosh is a 21 y.o. female reports 3 months of rectal discomfort and bleeding with every bowel movement. She has tried multiple OTC meds without improvement. She feels a hemorrhoid that is tender and worsened pain when straining for BM. She has not had any vomiting or fever. She has some cramping abdominal pain. Constipation is not improved with prunes. She has not sought medical care until tonight.  ? ? ?Home Medications ?Prior to Admission medications   ?Medication Sig Start Date End Date Taking? Authorizing Provider  ?hydrocortisone (ANUSOL-HC) 2.5 % rectal cream Place 1 application. rectally 2 (two) times daily. 03/03/22  Yes Pollyann Savoy, MD  ?polyethylene glycol powder (GLYCOLAX/MIRALAX) 17 GM/SCOOP powder Take 17 g by mouth daily. 03/03/22  Yes Pollyann Savoy, MD  ?witch hazel-glycerin (TUCKS) pad Apply 1 application. topically as needed for itching. 03/03/22  Yes Pollyann Savoy, MD  ?etonogestrel-ethinyl estradiol (NUVARING) 0.12-0.015 MG/24HR vaginal ring Insert vaginally and leave in place for 3 consecutive weeks, then remove for 1 week. 01/30/21   Calvert Cantor, CNM  ?dicyclomine (BENTYL) 20 MG tablet Take 1 tablet (20 mg total) by mouth 2 (two) times daily. 10/04/19 04/05/20  Dartha Lodge, PA-C  ?famotidine (PEPCID) 20 MG tablet Take 1 tablet (20 mg total) by mouth 2 (two) times daily. 10/04/19 04/05/20  Dartha Lodge, PA-C  ? ? ? ?Allergies    ?Eggs or egg-derived products, Peanuts [peanut oil], Penicillin g, and Promethazine ? ? ?Review of Systems   ?Review of Systems ?Please see HPI for pertinent positives and negatives ? ?Physical Exam ?BP 120/81   Pulse 61   Temp 98.1 ?F (36.7 ?C) (Oral)   Resp 18   Ht 4\' 11"  (1.499 m)   Wt 81.2 kg   LMP 02/06/2022 (Approximate)   SpO2 100%   BMI 36.15  kg/m?  ? ?Physical Exam ?Vitals and nursing note reviewed.  ?HENT:  ?   Head: Normocephalic.  ?   Nose: Nose normal.  ?Eyes:  ?   Extraocular Movements: Extraocular movements intact.  ?Pulmonary:  ?   Effort: Pulmonary effort is normal.  ?Genitourinary: ?   Comments: Chaperone present. Multiple small/moderate sized, non thrombosed, non bleeding hemorrhoids. No obvious fissure but patient did not tolerate digital exam.  ?Musculoskeletal:     ?   General: Normal range of motion.  ?   Cervical back: Neck supple.  ?Skin: ?   Findings: No rash (on exposed skin).  ?Neurological:  ?   Mental Status: She is alert and oriented to person, place, and time.  ?Psychiatric:     ?   Mood and Affect: Mood normal.  ? ? ?ED Results / Procedures / Treatments   ?EKG ?None ? ?Procedures ?Procedures ? ?Medications Ordered in the ED ?Medications - No data to display ? ?Initial Impression and Plan ? Patient with chronic symptomatic hemorrhoids, given advise on OTC hemorrhoid ointments and stool softeners. Referral to Gen Surg for definitive care.  ? ?ED Course  ? ?  ? ? ?MDM Rules/Calculators/A&P ?Medical Decision Making ?Problems Addressed: ?Hemorrhoids, unspecified hemorrhoid type: chronic illness or injury ? ?Risk ?OTC drugs. ? ? ? ?Final Clinical Impression(s) / ED Diagnoses ?Final diagnoses:  ?Hemorrhoids, unspecified hemorrhoid type  ? ? ?Rx / DC Orders ?ED Discharge Orders   ? ?  Ordered  ?  hydrocortisone (ANUSOL-HC) 2.5 % rectal cream  2 times daily       ? 03/03/22 0317  ?  witch hazel-glycerin (TUCKS) pad  As needed       ? 03/03/22 0317  ?  polyethylene glycol powder (GLYCOLAX/MIRALAX) 17 GM/SCOOP powder  Daily       ? 03/03/22 0317  ? ?  ?  ? ?  ? ?  ?Pollyann Savoy, MD ?03/03/22 [phone] ? ?

## 2022-03-20 ENCOUNTER — Other Ambulatory Visit: Payer: Self-pay

## 2022-03-20 ENCOUNTER — Emergency Department (HOSPITAL_BASED_OUTPATIENT_CLINIC_OR_DEPARTMENT_OTHER)
Admission: EM | Admit: 2022-03-20 | Discharge: 2022-03-20 | Disposition: A | Discharge status: Home / Self Care | Payer: Medicaid Other | Attending: Emergency Medicine | Admitting: Emergency Medicine

## 2022-03-20 ENCOUNTER — Encounter (HOSPITAL_BASED_OUTPATIENT_CLINIC_OR_DEPARTMENT_OTHER): Payer: Self-pay

## 2022-03-20 DIAGNOSIS — Z20822 Contact with and (suspected) exposure to covid-19: Secondary | ICD-10-CM | POA: Insufficient documentation

## 2022-03-20 DIAGNOSIS — J0100 Acute maxillary sinusitis, unspecified: Secondary | ICD-10-CM | POA: Insufficient documentation

## 2022-03-20 DIAGNOSIS — J039 Acute tonsillitis, unspecified: Secondary | ICD-10-CM | POA: Diagnosis not present

## 2022-03-20 DIAGNOSIS — Z91010 Allergy to peanuts: Secondary | ICD-10-CM | POA: Insufficient documentation

## 2022-03-20 DIAGNOSIS — J358 Other chronic diseases of tonsils and adenoids: Secondary | ICD-10-CM

## 2022-03-20 DIAGNOSIS — J029 Acute pharyngitis, unspecified: Secondary | ICD-10-CM | POA: Diagnosis present

## 2022-03-20 LAB — RESP PANEL BY RT-PCR (FLU A&B, COVID) ARPGX2
Influenza A by PCR: NEGATIVE
Influenza B by PCR: NEGATIVE
SARS Coronavirus 2 by RT PCR: NEGATIVE

## 2022-03-20 LAB — GROUP A STREP BY PCR: Group A Strep by PCR: NOT DETECTED

## 2022-03-20 MED ORDER — CLINDAMYCIN HCL 150 MG PO CAPS: 300.0000 mg | ORAL_CAPSULE | Freq: Four times a day (QID) | ORAL | 0 refills | Status: AC

## 2022-03-20 MED ORDER — DEXAMETHASONE 4 MG PO TABS
10.0000 mg | ORAL_TABLET | Freq: Once | ORAL | Status: AC
  Administered 2022-03-20: 10 mg via ORAL
  Filled 2022-03-20: qty 3

## 2022-03-20 NOTE — ED Notes (Signed)
Pt out of room speaking with RN sts that "my kids are in the car, I cannot sit here all day waiting", explained to pt that pt are seen by acuity and that her swabs are still in process. Pt visibly upset but went back into room after speaking with RN ?

## 2022-03-20 NOTE — ED Provider Notes (Signed)
?[facility] EMER[facility]PT ?Provider Note ? ? ?CSN: [id number] ?Arrival date & time: 03/20/22  1927 ? ?  ? ?History ? ?Chief Complaint  ?Patient presents with  ? Sore Throat  ? ? ? ?HPI ?Kristie Mcintosh is a 21yo female with history of adenoidectomy/tympanostomy tubes, recurrent tonsillitis per patient presents with concern for sore throat for 3-4 days.  ?  ? ?Sore throat for 3-4 days, both sides, tonsils enlarged bilaterally, white exudate, taking zyrtec for allergies ?No known sick contacts ?Fever last night, fever 5-6AM felt hot ?Cough, congestion ?No Nausea vomiting ?Right sided headache, pressure, congestion, started while in the ED, blowing nose ?No real voice changes, painful swallowing ?Also notes history of tonsilliths, halitosis, chronic feeling of mucus. Is able to remove the tonsil stones  ?She reports recurrent tonsillitis ? ? ?Past Surgical History:  ?Procedure Laterality Date  ? ADENOIDECTOMY AND MYRINGOTOMY WITH TUBE PLACEMENT Bilateral 2001  ? WISDOM TOOTH EXTRACTION Bilateral   ?  ?Home Medications ?Prior to Admission medications   ?Medication Sig Start Date End Date Taking? Authorizing Provider  ?clindamycin (CLEOCIN) 150 MG capsule Take 2 capsules (300 mg total) by mouth every 6 (six) hours for 10 days. 03/20/22 03/30/22 Yes Alvira Monday, MD  ?etonogestrel-ethinyl estradiol (NUVARING) 0.12-0.015 MG/24HR vaginal ring Insert vaginally and leave in place for 3 consecutive weeks, then remove for 1 week. 01/30/21   Calvert Cantor, CNM  ?hydrocortisone (ANUSOL-HC) 2.5 % rectal cream Place 1 application. rectally 2 (two) times daily. 03/03/22   Pollyann Savoy, MD  ?polyethylene glycol powder (GLYCOLAX/MIRALAX) 17 GM/SCOOP powder Take 17 g by mouth daily. 03/03/22   Pollyann Savoy, MD  ?witch hazel-glycerin (TUCKS) pad Apply 1 application. topically as needed for itching. 03/03/22   Pollyann Savoy, MD  ?dicyclomine (BENTYL) 20 MG tablet Take 1 tablet (20 mg total) by mouth 2 (two)  times daily. 10/04/19 04/05/20  Dartha Lodge, PA-C  ?famotidine (PEPCID) 20 MG tablet Take 1 tablet (20 mg total) by mouth 2 (two) times daily. 10/04/19 04/05/20  Dartha Lodge, PA-C  ?   ? ?Allergies    ?Eggs or egg-derived products, Peanuts [peanut oil], Penicillin g, and Promethazine   ? ?Review of Systems   ?Review of Systems ? ?Physical Exam ?Updated Vital Signs ?BP 126/86 (BP Location: Right Arm)   Pulse 80   Temp 99 ?F (37.2 ?C)   Resp 19   Ht 4\' 11"  (1.499 m)   Wt 81.2 kg   SpO2 100%   BMI 36.16 kg/m?  ?Physical Exam ?Vitals and nursing note reviewed.  ?Constitutional:   ?   General: She is not in acute distress. ?   Appearance: Normal appearance. She is not ill-appearing, toxic-appearing or diaphoretic.  ?HENT:  ?   Head: Normocephalic.  ?   Comments: Right maxillary sinus tenderness ?   Mouth/Throat:  ?   Pharynx: Uvula midline. Oropharyngeal exudate present. No uvula swelling.  ?Eyes:  ?   Conjunctiva/sclera: Conjunctivae normal.  ?Cardiovascular:  ?   Rate and Rhythm: Normal rate and regular rhythm.  ?   Pulses: Normal pulses.  ?Pulmonary:  ?   Effort: Pulmonary effort is normal. No respiratory distress.  ?Musculoskeletal:     ?   General: No deformity or signs of injury.  ?   Cervical back: No rigidity.  ?Skin: ?   General: Skin is warm and dry.  ?   Coloration: Skin is not jaundiced or pale.  ?Neurological:  ?   General:  No focal deficit present.  ?   Mental Status: She is alert and oriented to person, place, and time.  ? ? ?ED Results / Procedures / Treatments   ?Labs ?(all labs ordered are listed, but only abnormal results are displayed) ?Labs Reviewed  ?GROUP A STREP BY PCR  ?RESP PANEL BY RT-PCR (FLU A&B, COVID) [id number]  ? ? ?EKG ?None ? ?Radiology ?No results found. ? ?Procedures ?Procedures  ? ? ?Medications Ordered in ED ?Medications  ?dexamethasone (DECADRON) tablet 10 mg (10 mg Oral Given 03/20/22 2309)  ? ? ?ED Course/ Medical Decision Making/ A&P ?  ?                        ?Medical  Decision Making ?Risk ?Prescription drug management. ? ? ?Kristie Mcintosh is a 20yo female with history of adenoidectomy/tympanostomy tubes, tonsilliths, recurrent tonsillitis per patient presents with concern for sore throat for 3-4 days. ? ?DDx includes strep or viral pharyngitis, tonsillitis, epiglottitis, RPA, PTA.  ? ?Symmetric uvula on exam, do not suspect PTA clinically and do not see clinical signs of epiglottitis, RPA.   ? ?Strep, covid/flul swabs negative. Lower suspicion for EBV t this time. Suspect other viral syndrome. Does have tonsillar hypertrophy, also reports right facial pain, will give clindamycin for treatment for tonsillitis/sinusitis.  Given decadron in the ED.  Will give ENT follow up given her concerns for recurrent tonsillitis, tonsilliths, halitosis.  Patient discharged in stable condition with understanding of reasons to return.  ?  ? ? ? ? ? ? ? ?Final Clinical Impression(s) / ED Diagnoses ?Final diagnoses:  ?Tonsillith  ?Tonsillitis  ?Acute non-recurrent maxillary sinusitis  ? ? ?Rx / DC Orders ?ED Discharge Orders   ? ?      Ordered  ?  clindamycin (CLEOCIN) 150 MG capsule  Every 6 hours       ? 03/20/22 2303  ? ?  ?  ? ?  ? ? ?  ?Alvira Monday, MD ?03/21/22 [phone] ? ?
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
## 2022-03-20 NOTE — ED Triage Notes (Signed)
Patient here POV from Home. ? ?Endorses Sore Throat for approximately 3-4 Days. Associated with Body Aches, Congestion. ? ?Subjective Fever Last PM.  ? ?NAD Noted during Triage. A&Ox4. GCS 15. Ambulatory. ?

## 2022-07-22 ENCOUNTER — Encounter (HOSPITAL_BASED_OUTPATIENT_CLINIC_OR_DEPARTMENT_OTHER): Payer: Self-pay

## 2022-07-22 ENCOUNTER — Other Ambulatory Visit: Payer: Self-pay

## 2022-07-22 ENCOUNTER — Emergency Department (HOSPITAL_BASED_OUTPATIENT_CLINIC_OR_DEPARTMENT_OTHER): Payer: Medicaid Other | Admitting: Radiology

## 2022-07-22 ENCOUNTER — Emergency Department (HOSPITAL_BASED_OUTPATIENT_CLINIC_OR_DEPARTMENT_OTHER)
Admission: EM | Admit: 2022-07-22 | Discharge: 2022-07-22 | Disposition: A | Discharge status: Home / Self Care | Payer: Medicaid Other | Attending: Emergency Medicine | Admitting: Emergency Medicine

## 2022-07-22 DIAGNOSIS — M545 Low back pain, unspecified: Secondary | ICD-10-CM | POA: Diagnosis not present

## 2022-07-22 DIAGNOSIS — Z91010 Allergy to peanuts: Secondary | ICD-10-CM | POA: Insufficient documentation

## 2022-07-22 DIAGNOSIS — S134XXA Sprain of ligaments of cervical spine, initial encounter: Secondary | ICD-10-CM

## 2022-07-22 DIAGNOSIS — S199XXA Unspecified injury of neck, initial encounter: Secondary | ICD-10-CM | POA: Insufficient documentation

## 2022-07-22 DIAGNOSIS — M549 Dorsalgia, unspecified: Secondary | ICD-10-CM

## 2022-07-22 LAB — PREGNANCY, URINE: Preg Test, Ur: NEGATIVE

## 2022-07-22 MED ORDER — CYCLOBENZAPRINE HCL 10 MG PO TABS: 10.0000 mg | ORAL_TABLET | Freq: Two times a day (BID) | ORAL | 0 refills | Status: DC | PRN

## 2022-07-22 MED ORDER — KETOROLAC TROMETHAMINE 30 MG/ML IJ SOLN
30.0000 mg | Freq: Once | INTRAMUSCULAR | Status: DC
  Filled 2022-07-22: qty 1

## 2022-07-22 MED ORDER — IBUPROFEN 800 MG PO TABS
800.0000 mg | ORAL_TABLET | Freq: Once | ORAL | Status: AC
  Administered 2022-07-22: 800 mg via ORAL
  Filled 2022-07-22: qty 1
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
## 2022-07-22 NOTE — ED Notes (Signed)
Discharge paperwork given and verbally understood. 
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
## 2022-07-27 ENCOUNTER — Emergency Department (HOSPITAL_BASED_OUTPATIENT_CLINIC_OR_DEPARTMENT_OTHER)
Admission: EM | Admit: 2022-07-27 | Discharge: 2022-07-27 | Disposition: A | Discharge status: Home / Self Care | Payer: Medicaid Other | Attending: Emergency Medicine | Admitting: Emergency Medicine

## 2022-07-27 ENCOUNTER — Encounter (HOSPITAL_BASED_OUTPATIENT_CLINIC_OR_DEPARTMENT_OTHER): Payer: Self-pay

## 2022-07-27 DIAGNOSIS — Y92410 Unspecified street and highway as the place of occurrence of the external cause: Secondary | ICD-10-CM | POA: Insufficient documentation

## 2022-07-27 DIAGNOSIS — S39012A Strain of muscle, fascia and tendon of lower back, initial encounter: Secondary | ICD-10-CM | POA: Insufficient documentation

## 2022-07-27 DIAGNOSIS — Z91010 Allergy to peanuts: Secondary | ICD-10-CM | POA: Insufficient documentation

## 2022-07-27 DIAGNOSIS — M545 Low back pain, unspecified: Secondary | ICD-10-CM | POA: Diagnosis present

## 2022-07-27 MED ORDER — IBUPROFEN 600 MG PO TABS: 600.0000 mg | ORAL_TABLET | Freq: Four times a day (QID) | ORAL | 0 refills | Status: AC | PRN
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
## 2022-09-30 ENCOUNTER — Ambulatory Visit: Payer: Medicaid Other

## 2022-11-21 ENCOUNTER — Encounter: Payer: Self-pay | Admitting: Obstetrics and Gynecology

## 2022-11-21 ENCOUNTER — Other Ambulatory Visit (HOSPITAL_COMMUNITY)
Admission: RE | Admit: 2022-11-21 | Discharge: 2022-11-21 | Disposition: A | Discharge status: Home / Self Care | Payer: Medicaid Other | Source: Ambulatory Visit | Attending: Obstetrics and Gynecology | Admitting: Obstetrics and Gynecology

## 2022-11-21 ENCOUNTER — Ambulatory Visit (INDEPENDENT_AMBULATORY_CARE_PROVIDER_SITE_OTHER): Payer: Medicaid Other | Admitting: Obstetrics and Gynecology

## 2022-11-21 VITALS — BP 110/67 | HR 69 | Ht 59.0 in | Wt 170.0 lb

## 2022-11-21 DIAGNOSIS — E049 Nontoxic goiter, unspecified: Secondary | ICD-10-CM | POA: Diagnosis not present

## 2022-11-21 DIAGNOSIS — Z01419 Encounter for gynecological examination (general) (routine) without abnormal findings: Secondary | ICD-10-CM | POA: Diagnosis not present

## 2022-11-21 DIAGNOSIS — N898 Other specified noninflammatory disorders of vagina: Secondary | ICD-10-CM | POA: Diagnosis not present

## 2022-11-21 DIAGNOSIS — Z01411 Encounter for gynecological examination (general) (routine) with abnormal findings: Secondary | ICD-10-CM | POA: Diagnosis not present

## 2022-11-21 DIAGNOSIS — Z3009 Encounter for other general counseling and advice on contraception: Secondary | ICD-10-CM | POA: Diagnosis not present

## 2022-11-21 DIAGNOSIS — Z113 Encounter for screening for infections with a predominantly sexual mode of transmission: Secondary | ICD-10-CM | POA: Insufficient documentation

## 2022-11-21 DIAGNOSIS — Z3202 Encounter for pregnancy test, result negative: Secondary | ICD-10-CM

## 2022-11-21 LAB — POCT URINE PREGNANCY: Preg Test, Ur: NEGATIVE

## 2022-11-21 MED ORDER — ETONOGESTREL-ETHINYL ESTRADIOL 0.12-0.015 MG/24HR VA RING: VAGINAL_RING | VAGINAL | 12 refills | Status: DC

## 2022-11-21 NOTE — Progress Notes (Signed)
Pt would like to begin Nuvaring.  UPT: Negative

## 2022-11-21 NOTE — Progress Notes (Signed)
GYNECOLOGY ANNUAL PREVENTATIVE CARE ENCOUNTER NOTE  History:     Kristie Mcintosh is a 21 y.o. [id number] female here for a routine annual gynecologic exam.  Current complaints: none.   Denies abnormal vaginal bleeding, discharge, pelvic pain, problems with intercourse or other gynecologic concerns.    Gynecologic History No LMP recorded (lmp unknown). Contraception: NuvaRing vaginal inserts Last Pap: 7/21. Results were: normal with negative HPV Last mammogram: n/a  Obstetric History OB History  Gravida Para Term Preterm AB Living  2 2 2  0 0 2  SAB IAB Ectopic Multiple Live Births  0 0 0 0 2    # Outcome Date GA Lbr Len/2nd Weight Sex Delivery Anes PTL Lv  2 Term 12/26/20 [redacted]w[redacted]d / 00:09 5 lb 9.8 oz (2.546 kg) M Vag-Spont None  LIV     Birth Comments: WNL  1 Term 01/07/18 [redacted]w[redacted]d 07:36 / 00:11 5 lb 12.4 oz (2.62 kg) M Vag-Spont EPI  LIV    Past Medical History:  Diagnosis Date   Abdominal pain affecting pregnancy 05/16/2017   Amenorrhea 05/16/2017   Asthma    Bacterial vaginitis 05/16/2017   Headache(784.0)    Normal labor 12/26/2020   Obesity     Past Surgical History:  Procedure Laterality Date   ADENOIDECTOMY AND MYRINGOTOMY WITH TUBE PLACEMENT Bilateral 2001   WISDOM TOOTH EXTRACTION Bilateral     Current Outpatient Medications on File Prior to Visit  Medication Sig Dispense Refill   cyclobenzaprine (FLEXERIL) 10 MG tablet Take 1 tablet (10 mg total) by mouth 2 (two) times daily as needed for muscle spasms. (Patient not taking: Reported on 11/21/2022) 20 tablet 0   hydrocortisone (ANUSOL-HC) 2.5 % rectal cream Place 1 application. rectally 2 (two) times daily. (Patient not taking: Reported on 11/21/2022) 30 g 0   polyethylene glycol powder (GLYCOLAX/MIRALAX) 17 GM/SCOOP powder Take 17 g by mouth daily. (Patient not taking: Reported on 11/21/2022) 255 g 0   witch hazel-glycerin (TUCKS) pad Apply 1 application. topically as needed for itching. (Patient not taking: Reported on  11/21/2022) 40 each 12   [DISCONTINUED] dicyclomine (BENTYL) 20 MG tablet Take 1 tablet (20 mg total) by mouth 2 (two) times daily. 20 tablet 0   [DISCONTINUED] famotidine (PEPCID) 20 MG tablet Take 1 tablet (20 mg total) by mouth 2 (two) times daily. 30 tablet 0   No current facility-administered medications on file prior to visit.    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   Promethazine Hives and Nausea And Vomiting    Social History:  reports that she quit smoking about 5 years ago. Her smoking use included cigarettes. She smoked an average of .25 packs per day. She has never used smokeless tobacco. She reports current alcohol use. She reports current drug use. Frequency: 7.00 times per week. Drug: Marijuana.  Family History  Problem Relation Age of Onset   Headache Mother    Heart disease Mother    Migraines Maternal Grandmother    Depression  Maternal Grandmother    Heart Problems Maternal Grandfather    Bipolar disorder Paternal Aunt    Autism Cousin        2 Maternal 1st Cousins have Autism   Hyperlipidemia Other    Diabetes Other    Heart disease Other    Hypertension Other     The following portions of the patient's history were reviewed and updated as appropriate: allergies, current medications, past family history, past medical history, past social history, past surgical history and problem list.  Review of Systems Pertinent items noted in HPI and remainder of  comprehensive ROS otherwise negative.  Physical Exam:  BP 110/67   Pulse 69   Ht 4\' 11"  (1.499 m)   Wt 170 lb (77.1 kg)   LMP  (LMP Unknown)   BMI 34.34 kg/m  CONSTITUTIONAL: Well-developed, well-nourished female in no acute distress.  HENT:  Normocephalic, atraumatic, External right and left ear normal. Oropharynx is clear and moist EYES: Conjunctivae and EOM are normal. NECK: Normal range of motion, supple, no masses.  Possible enlarged right lobe of thyroid, nontender  SKIN: Skin is warm and dry. No rash noted. Not diaphoretic. No erythema. No pallor. MUSCULOSKELETAL: Normal range of motion. No tenderness.  No cyanosis, clubbing, or edema.  2+ distal pulses. NEUROLOGIC: Alert and oriented to person, place, and time. Normal reflexes, muscle tone coordination.  PSYCHIATRIC: Normal mood and affect. Normal behavior. Normal judgment and thought content. CARDIOVASCULAR: Normal heart rate noted, regular rhythm RESPIRATORY: Clear to auscultation bilaterally. Effort and breath sounds normal, no problems with respiration noted. BREASTS:deferred ABDOMEN: Soft, no distention noted.  No tenderness, rebound or guarding.  PELVIC: Normal appearing external genitalia and urethral meatus; normal appearing vaginal mucosa and cervix.  No abnormal discharge noted.  Pap smear obtained.  Vaginal swab taken.  Normal uterine size, no other palpable masses, no uterine or adnexal tenderness.  Performed in the presence of a chaperone.   Assessment and Plan:    1. Vaginal irritation  - Cervicovaginal ancillary only  2. Screening for STDs (sexually transmitted diseases) Per pt request  - Cervicovaginal ancillary only - Hepatitis B surface antigen - Hepatitis C antibody - HIV Antibody (routine testing w rflx) - RPR  3. Counseling for initiation of birth control method Pt has used nuvaring before and would like to try again - POCT urine pregnancy - etonogestrel-ethinyl estradiol (NUVARING) 0.12-0.015  MG/24HR vaginal ring; Insert vaginally and leave in place for 3 consecutive weeks, then remove for 1 week.  Dispense: 1 each; Refill: 12  4. Women's annual routine gynecological examination Normal annual exam  - Cytology - PAP  5. Enlarged thyroid gland Thyroid felt mildly enlarged on the right, will check thyroid labs and get thyroid 12-21-1975 for further eval  - Thyroid Panel With TSH - US THYROID; Future  Will follow up results of pap smear and manage accordingly. Routine preventative health maintenance measures emphasized. Please refer to After Visit Summary for other counseling recommendations.      [place], MD, FACOG Obstetrician & Gynecologist, [facility], [facility]

## 2022-11-22 LAB — CERVICOVAGINAL ANCILLARY ONLY
Bacterial Vaginitis (gardnerella): NEGATIVE
Candida Glabrata: NEGATIVE
Candida Vaginitis: POSITIVE — AB
Chlamydia: POSITIVE — AB
Comment: NEGATIVE
Comment: NEGATIVE
Comment: NEGATIVE
Comment: NEGATIVE
Comment: NEGATIVE
Comment: NORMAL
Neisseria Gonorrhea: NEGATIVE
Trichomonas: NEGATIVE

## 2022-11-22 LAB — HEPATITIS C ANTIBODY: Hep C Virus Ab: NONREACTIVE

## 2022-11-22 LAB — RPR: RPR Ser Ql: NONREACTIVE

## 2022-11-22 LAB — HEPATITIS B SURFACE ANTIGEN: Hepatitis B Surface Ag: NEGATIVE

## 2022-11-22 LAB — THYROID PANEL WITH TSH
Free Thyroxine Index: 1.8 (ref 1.2–4.9)
T3 Uptake Ratio: 28 % (ref 24–39)
T4, Total: 6.6 ug/dL (ref 4.5–12.0)
TSH: 0.946 u[IU]/mL (ref 0.450–4.500)

## 2022-11-22 LAB — HIV ANTIBODY (ROUTINE TESTING W REFLEX): HIV Screen 4th Generation wRfx: NONREACTIVE

## 2022-11-26 ENCOUNTER — Other Ambulatory Visit (INDEPENDENT_AMBULATORY_CARE_PROVIDER_SITE_OTHER): Payer: Medicaid Other | Admitting: Obstetrics and Gynecology

## 2022-11-26 ENCOUNTER — Other Ambulatory Visit: Payer: Self-pay | Admitting: Obstetrics and Gynecology

## 2022-11-26 DIAGNOSIS — A749 Chlamydial infection, unspecified: Secondary | ICD-10-CM

## 2022-11-26 DIAGNOSIS — B3731 Acute candidiasis of vulva and vagina: Secondary | ICD-10-CM

## 2022-11-26 MED ORDER — FLUCONAZOLE 150 MG PO TABS: 150.0000 mg | ORAL_TABLET | Freq: Once | ORAL | 3 refills | Status: AC

## 2022-11-26 MED ORDER — AZITHROMYCIN 500 MG PO TABS: 1000.0000 mg | ORAL_TABLET | Freq: Once | ORAL | 0 refills | Status: AC

## 2022-11-26 NOTE — Progress Notes (Signed)
Rx for diflucan sent

## 2022-11-27 ENCOUNTER — Telehealth: Payer: Self-pay

## 2022-11-27 LAB — CYTOLOGY - PAP
Comment: NEGATIVE
Diagnosis: UNDETERMINED — AB
High risk HPV: NEGATIVE

## 2022-11-27 NOTE — Telephone Encounter (Signed)
-----   Message from Griffin Basil, MD sent at 11/26/2022  5:55 PM EST ----- Chlamydia positive and swab positive for yeast, rx sent for treatment TOC in 6-8 weeks
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
## 2022-12-02 ENCOUNTER — Ambulatory Visit (HOSPITAL_COMMUNITY): Payer: Medicaid Other

## 2022-12-19 ENCOUNTER — Ambulatory Visit (INDEPENDENT_AMBULATORY_CARE_PROVIDER_SITE_OTHER): Payer: Medicaid Other | Admitting: General Practice

## 2022-12-19 ENCOUNTER — Other Ambulatory Visit (HOSPITAL_COMMUNITY)
Admission: RE | Admit: 2022-12-19 | Discharge: 2022-12-19 | Disposition: A | Discharge status: Home / Self Care | Payer: Medicaid Other | Source: Ambulatory Visit | Attending: Obstetrics and Gynecology | Admitting: Obstetrics and Gynecology

## 2022-12-19 VITALS — BP 120/74 | HR 78 | Ht 59.0 in | Wt 167.9 lb

## 2022-12-19 DIAGNOSIS — A749 Chlamydial infection, unspecified: Secondary | ICD-10-CM | POA: Insufficient documentation

## 2022-12-19 DIAGNOSIS — Z113 Encounter for screening for infections with a predominantly sexual mode of transmission: Secondary | ICD-10-CM

## 2022-12-19 NOTE — Progress Notes (Signed)
SUBJECTIVE:  22 y.o. female presents for TOC.  Positive Chlamydia 11-21-22. Denies abnormal vaginal bleeding or significant pelvic pain or fever. No UTI symptoms. Denies history of known exposure to STD.  No LMP recorded (lmp unknown).  OBJECTIVE:  She appears well, afebrile. Urine dipstick: not done.  ASSESSMENT:  Vaginal Discharge  Vaginal Odor   PLAN:  GC, chlamydia, trichomonas, BVAG, CVAG probe sent to lab. Treatment: To be determined once lab results are received ROV prn if symptoms persist or worsen.

## 2022-12-20 LAB — CERVICOVAGINAL ANCILLARY ONLY
Bacterial Vaginitis (gardnerella): POSITIVE — AB
Candida Glabrata: NEGATIVE
Candida Vaginitis: NEGATIVE
Chlamydia: NEGATIVE
Comment: NEGATIVE
Comment: NEGATIVE
Comment: NEGATIVE
Comment: NEGATIVE
Comment: NEGATIVE
Comment: NORMAL
Neisseria Gonorrhea: NEGATIVE
Trichomonas: NEGATIVE

## 2022-12-21 ENCOUNTER — Other Ambulatory Visit: Payer: Self-pay | Admitting: Obstetrics & Gynecology

## 2022-12-21 MED ORDER — METRONIDAZOLE 0.75 % VA GEL: 1.0000 | Freq: Every day | VAGINAL | 5 refills | Status: DC

## 2022-12-25 MED ORDER — METRONIDAZOLE 500 MG PO TABS: 500.0000 mg | ORAL_TABLET | Freq: Two times a day (BID) | ORAL | 0 refills | Status: DC

## 2023-01-10 ENCOUNTER — Ambulatory Visit: Payer: Medicaid Other

## 2023-04-01 ENCOUNTER — Emergency Department (HOSPITAL_BASED_OUTPATIENT_CLINIC_OR_DEPARTMENT_OTHER)
Admission: EM | Admit: 2023-04-01 | Discharge: 2023-04-01 | Discharge status: Against Medical Advice | Payer: Medicaid Other | Attending: Emergency Medicine | Admitting: Emergency Medicine

## 2023-04-01 ENCOUNTER — Other Ambulatory Visit: Payer: Self-pay

## 2023-04-01 DIAGNOSIS — Z5321 Procedure and treatment not carried out due to patient leaving prior to being seen by health care provider: Secondary | ICD-10-CM | POA: Diagnosis not present

## 2023-04-01 DIAGNOSIS — R1032 Left lower quadrant pain: Secondary | ICD-10-CM | POA: Diagnosis present

## 2023-04-01 LAB — URINALYSIS, ROUTINE W REFLEX MICROSCOPIC
Bacteria, UA: NONE SEEN
Bilirubin Urine: NEGATIVE
Glucose, UA: NEGATIVE mg/dL
Hgb urine dipstick: NEGATIVE
Ketones, ur: 40 mg/dL — AB
Leukocytes,Ua: NEGATIVE
Nitrite: NEGATIVE
Protein, ur: 30 mg/dL — AB
Specific Gravity, Urine: 1.034 — ABNORMAL HIGH (ref 1.005–1.030)
pH: 6.5 (ref 5.0–8.0)

## 2023-04-01 LAB — CBC
HCT: 40.4 % (ref 36.0–46.0)
Hemoglobin: 13.3 g/dL (ref 12.0–15.0)
MCH: 29.5 pg (ref 26.0–34.0)
MCHC: 32.9 g/dL (ref 30.0–36.0)
MCV: 89.6 fL (ref 80.0–100.0)
Platelets: 205 10*3/uL (ref 150–400)
RBC: 4.51 MIL/uL (ref 3.87–5.11)
RDW: 12.2 % (ref 11.5–15.5)
WBC: 8.9 10*3/uL (ref 4.0–10.5)
nRBC: 0 % (ref 0.0–0.2)

## 2023-04-01 LAB — COMPREHENSIVE METABOLIC PANEL
ALT: 14 U/L (ref 0–44)
AST: 19 U/L (ref 15–41)
Albumin: 4.6 g/dL (ref 3.5–5.0)
Alkaline Phosphatase: 68 U/L (ref 38–126)
Anion gap: 10 (ref 5–15)
BUN: 11 mg/dL (ref 6–20)
CO2: 23 mmol/L (ref 22–32)
Calcium: 9.7 mg/dL (ref 8.9–10.3)
Chloride: 104 mmol/L (ref 98–111)
Creatinine, Ser: 0.69 mg/dL (ref 0.44–1.00)
GFR, Estimated: >60 mL/min (ref >60)
Glucose, Bld: 99 mg/dL (ref 70–99)
Potassium: 3.8 mmol/L (ref 3.5–5.1)
Sodium: 137 mmol/L (ref 135–145)
Total Bilirubin: 0.4 mg/dL (ref 0.3–1.2)
Total Protein: 8 g/dL (ref 6.5–8.1)

## 2023-04-01 LAB — LIPASE, BLOOD: Lipase: 39 U/L (ref 11–51)

## 2023-04-01 LAB — PREGNANCY, URINE: Preg Test, Ur: NEGATIVE

## 2023-04-01 NOTE — ED Triage Notes (Addendum)
Ambulatory to triage. Tearful. Reports LRQ pain for several days. Reports blood in stool past several day-bright red blood. Reports hx hemorrhoids- "push one back up" today. Reports emesis several times today as well with streaks of red.

## 2023-05-26 ENCOUNTER — Ambulatory Visit: Payer: Medicaid Other

## 2023-09-04 ENCOUNTER — Inpatient Hospital Stay (HOSPITAL_COMMUNITY)
Admission: AD | Admit: 2023-09-04 | Discharge: 2023-09-04 | Discharge status: Against Medical Advice | Payer: Medicaid Other | Attending: Obstetrics & Gynecology | Admitting: Obstetrics & Gynecology

## 2023-09-04 ENCOUNTER — Ambulatory Visit: Payer: Medicaid Other | Admitting: Obstetrics and Gynecology

## 2023-09-04 DIAGNOSIS — K625 Hemorrhage of anus and rectum: Secondary | ICD-10-CM | POA: Insufficient documentation

## 2023-09-04 DIAGNOSIS — R109 Unspecified abdominal pain: Secondary | ICD-10-CM | POA: Insufficient documentation

## 2023-09-04 DIAGNOSIS — Z3202 Encounter for pregnancy test, result negative: Secondary | ICD-10-CM | POA: Diagnosis not present

## 2023-09-04 LAB — POCT PREGNANCY, URINE: Preg Test, Ur: NEGATIVE

## 2023-09-04 NOTE — MAU Note (Signed)
.  Kristie Mcintosh is a 22 y.o. at Unknown here in MAU reporting: abd pain that stared about a week ago. Today woke up with increased abd pain and has vomited several times. Stated she feels weak. Was unable to go to her appointment at [facility] to check if she was pregnant. Called to [facility] and they told her to come to [facility] if she was feeling that bad. Stated she has has these symtoms last time she was pregnant. ( Our UPT is negative.) pt has not taken a pregnancy test at home.  LMP: 08/10/2023( approx) Onset of complaint: 1 week Pain score: 9-10 Vitals:   09/04/23 1235  BP: 126/75  Pulse: 65  Resp: 18  Temp: 98.3 F (36.8 C)     FHT:n/a Lab orders placed from triage:

## 2023-09-04 NOTE — MAU Provider Note (Signed)
Event Date/Time   First Provider Initiated Contact with Patient 09/04/23 1303      S [name] is a 22 y.o. [id number] patient who presents to MAU today with complaint of abdominal pain.  She states the pain started about one week ago.  She states she contributed her symptoms to a change in her menstrual cycle.   She states this morning she did not feel well and "threw up everywhere."  She states she also had hot flashes.  She states she has drank ginger ale.  She states she has experienced similar symptoms, in the past, with a pregnancy.  Patient states she has been having some rectal bleeding that is "a lot."    O BP 126/75   Pulse 65   Temp 98.3 F (36.8 C)   Resp 18   Ht 4\' 11"  (1.499 m)   LMP 08/10/2023 (Approximate)   BMI 33.91 kg/m  Physical Exam Constitutional:      Appearance: She is well-developed.  HENT:     Head: Normocephalic and atraumatic.  Pulmonary:     Effort: Pulmonary effort is normal. No respiratory distress.  Skin:    General: Skin is warm and dry.  Neurological:     Mental Status: She is alert.  Psychiatric:        Mood and Affect: Mood normal.        Behavior: Behavior normal.    A Medical screening exam complete Abdominal Pain Rectal Bleeding  P Patient informed of negative UPT today. Discussed transfer to ED or urgent care, but then patient reports rectal bleeding. Provider recommends transfer to [facility] for further evaluation. Patient agreeable and will ambulate to ED. Declines chaperone.  Charge nurse notified of anticipated arrival.  Gerrit Heck, [place] 09/04/2023 1:04 PM ,a

## 2023-09-04 NOTE — MAU Note (Signed)
Called ED Registration to inquire if patient arrived as patient was to ambulate to ED and declined a chaperone. ED Registration states patient never arrived.

## 2024-03-31 ENCOUNTER — Encounter (HOSPITAL_BASED_OUTPATIENT_CLINIC_OR_DEPARTMENT_OTHER): Payer: Self-pay | Admitting: Emergency Medicine

## 2024-03-31 ENCOUNTER — Emergency Department (HOSPITAL_BASED_OUTPATIENT_CLINIC_OR_DEPARTMENT_OTHER)
Admission: EM | Admit: 2024-03-31 | Discharge: 2024-03-31 | Disposition: A | Discharge status: Home / Self Care | Payer: Worker's Compensation | Attending: Emergency Medicine | Admitting: Emergency Medicine

## 2024-03-31 ENCOUNTER — Emergency Department (HOSPITAL_BASED_OUTPATIENT_CLINIC_OR_DEPARTMENT_OTHER): Payer: Self-pay | Admitting: Radiology

## 2024-03-31 ENCOUNTER — Other Ambulatory Visit: Payer: Self-pay

## 2024-03-31 DIAGNOSIS — S6992XA Unspecified injury of left wrist, hand and finger(s), initial encounter: Secondary | ICD-10-CM | POA: Diagnosis present

## 2024-03-31 DIAGNOSIS — Y990 Civilian activity done for income or pay: Secondary | ICD-10-CM | POA: Diagnosis not present

## 2024-03-31 DIAGNOSIS — W228XXA Striking against or struck by other objects, initial encounter: Secondary | ICD-10-CM | POA: Insufficient documentation

## 2024-03-31 DIAGNOSIS — J45909 Unspecified asthma, uncomplicated: Secondary | ICD-10-CM | POA: Diagnosis not present

## 2024-03-31 DIAGNOSIS — S60012A Contusion of left thumb without damage to nail, initial encounter: Secondary | ICD-10-CM | POA: Diagnosis not present

## 2024-03-31 DIAGNOSIS — Z91010 Allergy to peanuts: Secondary | ICD-10-CM | POA: Diagnosis not present

## 2024-03-31 DIAGNOSIS — M79645 Pain in left finger(s): Secondary | ICD-10-CM

## 2024-03-31 NOTE — ED Provider Notes (Signed)
 [place] EMERGENCY DEPARTMENT AT [facility] Provider Note   CSN: [id number] Arrival date & time: 03/31/24  1744     History {Add pertinent medical, surgical, social history, OB history to HPI:1} Chief Complaint  Patient presents with   Finger Injury    Kristie Mcintosh is a 23 y.o. female.  HPI      Works with [employer] at the [employer]. Partner was turning off the machine, then turned off the machine without help, the paddle came down onto her hand, barely missed whole hand but caught her thumb.  Throbbing pain, little bit discolored.   Have small children, worried about going back to work. Reported to supervisor.   Distal thumb pain   Home Medications Prior to Admission medications   Medication Sig Start Date End Date Taking? Authorizing Provider  metroNIDAZOLE  (METROGEL ) 0.75 % vaginal gel Place 1 Applicatorful vaginally at bedtime. 12/21/22   Wendelyn Halter, MD  cyclobenzaprine  (FLEXERIL ) 10 MG tablet Take 1 tablet (10 mg total) by mouth 2 (two) times daily as needed for muscle spasms. Patient not taking: Reported on 11/21/2022 07/22/22   Arnie Bibber, MD  etonogestrel -ethinyl estradiol  (NUVARING) 0.12-0.015 MG/24HR vaginal ring Insert vaginally and leave in place for 3 consecutive weeks, then remove for 1 week. 11/21/22   Abigail Abler, MD  hydrocortisone  (ANUSOL -HC) 2.5 % rectal cream Place 1 application. rectally 2 (two) times daily. Patient not taking: Reported on 11/21/2022 03/03/22   Charmayne Cooper, MD  metroNIDAZOLE  (FLAGYL ) 500 MG tablet Take 1 tablet (500 mg total) by mouth 2 (two) times daily. 12/25/22   Constant, Peggy, MD  polyethylene glycol powder (GLYCOLAX /MIRALAX ) 17 GM/SCOOP powder Take 17 g by mouth daily. Patient not taking: Reported on 11/21/2022 03/03/22   Charmayne Cooper, MD  witch hazel-glycerin  (TUCKS) pad Apply 1 application. topically as needed for itching. Patient not taking: Reported on 11/21/2022 03/03/22   Charmayne Cooper,  MD  dicyclomine  (BENTYL ) 20 MG tablet Take 1 tablet (20 mg total) by mouth 2 (two) times daily. 10/04/19 04/05/20  Everlyn Hockey, PA-C  famotidine  (PEPCID ) 20 MG tablet Take 1 tablet (20 mg total) by mouth 2 (two) times daily. 10/04/19 04/05/20  Everlyn Hockey, PA-C      Allergies    Peanuts [peanut oil], Penicillin g, and Promethazine     Review of Systems   Review of Systems  Physical Exam Updated Vital Signs BP 122/82 (BP Location: Right Arm)   Pulse 74   Temp 98.7 F (37.1 C)   Resp 16   SpO2 100%  Physical Exam  ED Results / Procedures / Treatments   Labs (all labs ordered are listed, but only abnormal results are displayed) Labs Reviewed - No data to display  EKG None  Radiology DG Hand Complete Left Result Date: 03/31/2024 CLINICAL DATA:  Left thumb swelling after injury today. EXAM: LEFT HAND - COMPLETE 3+ VIEW COMPARISON:  None Available. FINDINGS: There is no evidence of fracture or dislocation. There is no evidence of arthropathy or other focal bone abnormality. Soft tissues are unremarkable. IMPRESSION: Negative. Electronically Signed   By: Rosalene Colon M.D.   On: 03/31/2024 19:56    Procedures Procedures  {Document cardiac monitor, telemetry assessment procedure when appropriate:1}  Medications Ordered in ED Medications - No data to display  ED Course/ Medical Decision Making/ A&P   {   Click here for ABCD2, HEART and other calculatorsREFRESH Note before signing :1}  Medical Decision Making Amount and/or Complexity of Data Reviewed Radiology: ordered.   ***  {Document critical care time when appropriate:1} {Document review of labs and clinical decision tools ie heart score, Chads2Vasc2 etc:1}  {Document your independent review of radiology images, and any outside records:1} {Document your discussion with family members, caretakers, and with consultants:1} {Document social determinants of health affecting pt's  care:1} {Document your decision making why or why not admission, treatments were needed:1} Final Clinical Impression(s) / ED Diagnoses Final diagnoses:  None    Rx / DC Orders ED Discharge Orders     None

## 2024-03-31 NOTE — ED Triage Notes (Signed)
 States she had left thumb hit while at work today. Slightly swollen.

## 2024-04-07 ENCOUNTER — Emergency Department (HOSPITAL_BASED_OUTPATIENT_CLINIC_OR_DEPARTMENT_OTHER)
Admission: EM | Admit: 2024-04-07 | Discharge: 2024-04-07 | Disposition: A | Discharge status: Home / Self Care | Payer: Worker's Compensation | Attending: Emergency Medicine | Admitting: Emergency Medicine

## 2024-04-07 ENCOUNTER — Other Ambulatory Visit: Payer: Self-pay

## 2024-04-07 DIAGNOSIS — Z87891 Personal history of nicotine dependence: Secondary | ICD-10-CM | POA: Diagnosis not present

## 2024-04-07 DIAGNOSIS — Z91010 Allergy to peanuts: Secondary | ICD-10-CM | POA: Diagnosis not present

## 2024-04-07 DIAGNOSIS — M79645 Pain in left finger(s): Secondary | ICD-10-CM | POA: Diagnosis present

## 2024-04-07 DIAGNOSIS — L02512 Cutaneous abscess of left hand: Secondary | ICD-10-CM | POA: Diagnosis not present

## 2024-04-07 MED ORDER — LIDOCAINE HCL (PF) 1 % IJ SOLN
10.0000 mL | Freq: Once | INTRAMUSCULAR | Status: AC
  Administered 2024-04-07: 10 mL
  Filled 2024-04-07: qty 10

## 2024-04-07 MED ORDER — IBUPROFEN 600 MG PO TABS: 600.0000 mg | ORAL_TABLET | Freq: Four times a day (QID) | ORAL | 0 refills | Status: DC | PRN

## 2024-04-07 NOTE — Discharge Instructions (Addendum)
 As discussed, continue warm compresses on your left thumb.  You may take anti-inflammatories for pain.  Recommend follow-up with primary care for reassessment of your symptoms.  Please do not hesitate to return to the emergency department the worrisome signs and symptoms we discussed become apparent.

## 2024-04-07 NOTE — ED Triage Notes (Signed)
 Jammed finger-left thumb- at work-5/7. Seen here and discharged-no home meds. Returning for worsening pain- difficult to work with hands. Skin swollen around nail-denies discharge-fevers.

## 2024-04-07 NOTE — ED Provider Notes (Signed)
 [place] EMERGENCY DEPARTMENT AT [facility] Provider Note   CSN: [id number] Arrival date & time: 04/07/24  1339     History  Chief Complaint  Patient presents with   Hand Pain    Kristie Mcintosh is a 23 y.o. female.   Hand Pain   23 year old female presents emergency department with complaints of left thumb pain.  Patient originally suffered injury on 03/31/2024.  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  States the machine came down on her left thumb causing pain.  Was seen in the ED on the day of initial injury and had x-ray that was reassuring.  Since then, patient has noted continued swelling as well as pain on the right side of her nailbed.  Denies any drainage.  Denies any repeat injury.  Presents due to continued pain.  Past med history significant for migraine, ADD,  Home Medications Prior to Admission medications   Medication Sig Start Date End Date Taking? Authorizing Provider  ibuprofen  (ADVIL ) 600 MG tablet Take 1 tablet (600 mg total) by mouth every 6 (six) hours as needed. 04/07/24  Yes Neil Balls A, PA  metroNIDAZOLE  (METROGEL ) 0.75 % vaginal gel Place 1 Applicatorful vaginally at bedtime. 12/21/22   Wendelyn Halter, MD  cyclobenzaprine  (FLEXERIL ) 10 MG tablet Take 1 tablet (10 mg total) by mouth 2 (two) times daily as needed for muscle spasms. Patient not taking: Reported on 11/21/2022 07/22/22   Arnie Bibber, MD  etonogestrel -ethinyl estradiol  (NUVARING) 0.12-0.015 MG/24HR vaginal ring Insert vaginally and leave in place for 3 consecutive weeks, then remove for 1 week. 11/21/22   Abigail Abler, MD  hydrocortisone  (ANUSOL -HC) 2.5 % rectal cream Place 1 application. rectally 2 (two) times daily. Patient not taking: Reported on 11/21/2022 03/03/22   Charmayne Borden Thune, MD  metroNIDAZOLE  (FLAGYL ) 500 MG tablet Take 1 tablet (500 mg total) by mouth 2 (two) times daily. 12/25/22   Constant, Peggy, MD   polyethylene glycol powder (GLYCOLAX /MIRALAX ) 17 GM/SCOOP powder Take 17 g by mouth daily. Patient not taking: Reported on 11/21/2022 03/03/22   Charmayne Elchanan Bob, MD  witch hazel-glycerin  (TUCKS) pad Apply 1 application. topically as needed for itching. Patient not taking: Reported on 11/21/2022 03/03/22   Charmayne Atharv Barriere, MD  dicyclomine  (BENTYL ) 20 MG tablet Take 1 tablet (20 mg total) by mouth 2 (two) times daily. 10/04/19 04/05/20  Everlyn Hockey, PA-C  famotidine  (PEPCID ) 20 MG tablet Take 1 tablet (20 mg total) by mouth 2 (two) times daily. 10/04/19 04/05/20  Kehrli, Kelsey F, PA-C      Allergies    Peanuts [peanut oil], Penicillin g, and Promethazine     Review of Systems   Review of Systems  All other systems reviewed and are negative.   Physical Exam Updated Vital Signs BP 110/72 (BP Location: Right Arm)   Pulse (!) 56   Temp 97.7 F (36.5 C)   Resp 18   SpO2 100%  Physical Exam Vitals and nursing note reviewed.  Constitutional:      General: She is not in acute distress.    Appearance: She is well-developed.  HENT:     Head: Normocephalic and atraumatic.  Eyes:     Conjunctiva/sclera: Conjunctivae normal.  Cardiovascular:     Rate and Rhythm: Normal rate and regular rhythm.     Heart sounds: No murmur heard. Pulmonary:     Effort: Pulmonary effort is normal. No respiratory distress.  Breath sounds: Normal breath sounds.  Abdominal:     Palpations: Abdomen is soft.     Tenderness: There is no abdominal tenderness.  Musculoskeletal:        General: No swelling.     Cervical back: Neck supple.     Comments: Full range of motion left thumb.  Patient with swelling as well as ecchymosis appreciated on the lateral aspect of nailbed of left thumb.  Overlying tenderness.  No obvious sublingual hematoma present.  Skin:    General: Skin is warm and dry.     Capillary Refill: Capillary refill takes less than 2 seconds.  Neurological:     Mental Status: She is alert.   Psychiatric:        Mood and Affect: Mood normal.     ED Results / Procedures / Treatments   Labs (all labs ordered are listed, but only abnormal results are displayed) Labs Reviewed - No data to display  EKG None  Radiology No results found.  Procedures .Incision and Drainage  Date/Time: 04/07/2024 3:30 PM  Performed by: [place] Butter, PA Authorized by: [place] Butter, PA   Consent:    Consent obtained:  Verbal   Consent given by:  Patient   Risks, benefits, and alternatives were discussed: yes     Risks discussed:  Damage to other organs, incomplete drainage, infection, bleeding and pain   Alternatives discussed:  No treatment and delayed treatment Universal protocol:    Procedure explained and questions answered to patient or proxy's satisfaction: yes     Patient identity confirmed:  Verbally with patient Location:    Type:  Hematoma   Size:  1.6   Location:  Upper extremity   Upper extremity location:  Finger   Finger location:  L thumb Pre-procedure details:    Skin preparation:  Povidone-iodine Sedation:    Sedation type:  None Anesthesia:    Anesthesia method:  Local infiltration   Local anesthetic:  Lidocaine  1% w/o epi Procedure type:    Complexity:  Simple Procedure details:    Ultrasound guidance: no     Needle aspiration: no     Incision types:  Stab incision   Incision depth:  Dermal   Drainage:  Bloody   Drainage amount:  Moderate   Wound treatment:  Wound left open   Packing materials:  None Post-procedure details:    Procedure completion:  Tolerated well, no immediate complications     Medications Ordered in ED Medications  lidocaine  (PF) (XYLOCAINE ) 1 % injection 10 mL (10 mLs Other Given by Other 04/07/24 1459)    ED Course/ Medical Decision Making/ A&P                                 Medical Decision Making Risk Prescription drug management.   This patient presents to the ED for concern of hand pain, this involves an  extensive number of treatment options, and is a complaint that carries with it a high risk of complications and morbidity.  The differential diagnosis includes fracture, strain/pain, dislocation, ligamentous/tendinous injury, vascular compromise, subungual hematoma, paronychia, felon, other   Co morbidities that complicate the patient evaluation  See HPI   Additional history obtained:  Additional history obtained from EMR External records from outside source obtained and reviewed including hospital records   Lab Tests:  N/a   Imaging Studies ordered:  N/a   Cardiac Monitoring: / EKG:  N/a   Consultations Obtained:  N/a   Problem List / ED Course / Critical interventions / Medication management  Left thumb pain I ordered medication including lidocaine    Reevaluation of the patient after these medicines showed that the patient improved I have reviewed the patients home medicines and have made adjustments as needed   Social Determinants of Health:  Former cigarette use.  Denies illicit drug use.   Test / Admission - Considered:  Left thumb pain Vitals signs within normal range and stable throughout visit. 23 year old female presents emergency department with complaints of left thumb pain.  Patient originally suffered injury on 03/31/2024.  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  States the machine came down on her left thumb causing pain.  Was seen in the ED on the day of initial injury and had x-ray that was reassuring.  Since then, patient has noted continued swelling as well as pain on the right side of her nailbed.  Denies any drainage.  Denies any repeat injury.  Presents due to continued pain. On exam, area of ecchymosis appreciated lateral aspect of nailbed of left thumb.  Area with some fluctuance present.  X-ray obtained 7 days ago was negative for any acute osseous abnormality.  Symptoms seem to be more from the  swelling of left thumb.  Area locally anesthetized and drained in manner as above most consistent with hematoma.  No evidence clinically of paronychia, felon, cellulitis.  Will recommend use of NSAIDs for treatment of pain as well as heat to help with resolution of hematoma.  Will recommend follow-up with PCP in the outpatient setting for reassessment.  Treatment plan discussed with patient and she acknowledged understanding was agreeable to said plan.  Patient overall well-appearing, afebrile in no acute distress. Worrisome signs and symptoms were discussed with the patient, and the patient acknowledged understanding to return to the ED if noticed. Patient was stable upon discharge.          Final Clinical Impression(s) / ED Diagnoses Final diagnoses:  Pain of left thumb    Rx / DC Orders ED Discharge Orders          Ordered    ibuprofen  (ADVIL ) 600 MG tablet  Every 6 hours PRN        04/07/24 1506              Thurman Butter, [place] 04/07/24 1532    Auston Blush, MD 04/13/24 1318

## 2024-04-07 NOTE — ED Notes (Addendum)
 Discharge paperwork given and verbally understood.... No full assessment completed only DC assessment.Kristie AasAaron Mcintosh

## 2024-04-08 ENCOUNTER — Emergency Department (HOSPITAL_BASED_OUTPATIENT_CLINIC_OR_DEPARTMENT_OTHER)
Admission: EM | Admit: 2024-04-08 | Discharge: 2024-04-08 | Disposition: A | Discharge status: Home / Self Care | Payer: Worker's Compensation | Attending: Emergency Medicine | Admitting: Emergency Medicine

## 2024-04-08 ENCOUNTER — Encounter (HOSPITAL_BASED_OUTPATIENT_CLINIC_OR_DEPARTMENT_OTHER): Payer: Self-pay

## 2024-04-08 DIAGNOSIS — Z87891 Personal history of nicotine dependence: Secondary | ICD-10-CM | POA: Insufficient documentation

## 2024-04-08 DIAGNOSIS — Z91010 Allergy to peanuts: Secondary | ICD-10-CM | POA: Diagnosis not present

## 2024-04-08 DIAGNOSIS — M79645 Pain in left finger(s): Secondary | ICD-10-CM | POA: Diagnosis present

## 2024-04-08 DIAGNOSIS — L03012 Cellulitis of left finger: Secondary | ICD-10-CM

## 2024-04-08 MED ORDER — DOXYCYCLINE HYCLATE 100 MG PO CAPS: 100.0000 mg | ORAL_CAPSULE | Freq: Two times a day (BID) | ORAL | 0 refills | Status: DC

## 2024-04-08 NOTE — ED Provider Notes (Signed)
 [place] EMERGENCY DEPARTMENT AT [facility] Provider Note   CSN: [id number] Arrival date & time: 04/08/24  1222     History  Chief Complaint  Patient presents with   Nail Problem         Kristie Mcintosh is a 23 y.o. female.  HPI    23 year old female presents emergency department with complaints of left thumb pain.  Patient originally suffered injury on 03/31/2024.  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  States the machine came down on her left thumb causing pain.  Was seen in the ED on the day of initial injury and had x-ray that was reassuring.  Since then, patient has noted continued swelling as well as pain on the right side of her nailbed.  States the area began to drain more after ED visit yesterday.  Noticed purulent drainage.  States she had relief of swelling as well as pressure as area drained.   Past med history significant for migraine, ADD,    Home Medications Prior to Admission medications   Medication Sig Start Date End Date Taking? Authorizing Provider  doxycycline  (VIBRAMYCIN ) 100 MG capsule Take 1 capsule (100 mg total) by mouth 2 (two) times daily. 04/08/24  Yes Neil Balls A, PA  metroNIDAZOLE  (METROGEL ) 0.75 % vaginal gel Place 1 Applicatorful vaginally at bedtime. 12/21/22   Wendelyn Halter, MD  cyclobenzaprine  (FLEXERIL ) 10 MG tablet Take 1 tablet (10 mg total) by mouth 2 (two) times daily as needed for muscle spasms. Patient not taking: Reported on 11/21/2022 07/22/22   Arnie Bibber, MD  etonogestrel -ethinyl estradiol  (NUVARING) 0.12-0.015 MG/24HR vaginal ring Insert vaginally and leave in place for 3 consecutive weeks, then remove for 1 week. 11/21/22   Abigail Abler, MD  hydrocortisone  (ANUSOL -HC) 2.5 % rectal cream Place 1 application. rectally 2 (two) times daily. Patient not taking: Reported on 11/21/2022 03/03/22   Charmayne Katherine Syme, MD  ibuprofen  (ADVIL ) 600 MG tablet Take 1 tablet  (600 mg total) by mouth every 6 (six) hours as needed. 04/07/24   [place] Butter, PA  metroNIDAZOLE  (FLAGYL ) 500 MG tablet Take 1 tablet (500 mg total) by mouth 2 (two) times daily. 12/25/22   Constant, Peggy, MD  polyethylene glycol powder (GLYCOLAX /MIRALAX ) 17 GM/SCOOP powder Take 17 g by mouth daily. Patient not taking: Reported on 11/21/2022 03/03/22   Charmayne Jalana Moore, MD  witch hazel-glycerin  (TUCKS) pad Apply 1 application. topically as needed for itching. Patient not taking: Reported on 11/21/2022 03/03/22   Charmayne Rodriques Badie, MD  dicyclomine  (BENTYL ) 20 MG tablet Take 1 tablet (20 mg total) by mouth 2 (two) times daily. 10/04/19 04/05/20  Everlyn Hockey, PA-C  famotidine  (PEPCID ) 20 MG tablet Take 1 tablet (20 mg total) by mouth 2 (two) times daily. 10/04/19 04/05/20  Kehrli, Kelsey F, PA-C      Allergies    Peanuts [peanut oil], Penicillin g, and Promethazine     Review of Systems   Review of Systems  All other systems reviewed and are negative.   Physical Exam Updated Vital Signs BP 136/79 (BP Location: Right Arm)   Pulse 97   Temp 97.7 F (36.5 C)   Resp 18   Ht 4\' 11"  (1.499 m)   LMP 03/27/2024   SpO2 96%   BMI 33.91 kg/m  Physical Exam Vitals and nursing note reviewed.  Constitutional:      General: She is not in acute distress.  Appearance: She is well-developed.  HENT:     Head: Normocephalic and atraumatic.  Eyes:     Conjunctiva/sclera: Conjunctivae normal.  Cardiovascular:     Rate and Rhythm: Normal rate and regular rhythm.     Heart sounds: No murmur heard. Pulmonary:     Effort: Pulmonary effort is normal. No respiratory distress.     Breath sounds: Normal breath sounds.  Abdominal:     Palpations: Abdomen is soft.     Tenderness: There is no abdominal tenderness.  Musculoskeletal:        General: No swelling.     Cervical back: Neck supple.     Comments: Full range of motion left thumb.  Area of palpable fluctuance appreciated on the lateral  nailbed.  Able to express purulent drainage.  No obvious palpable tenderness or swelling on the pad of left thumb.  Capillary fill less than 2 seconds.  Skin:    General: Skin is warm and dry.     Capillary Refill: Capillary refill takes less than 2 seconds.  Neurological:     Mental Status: She is alert.  Psychiatric:        Mood and Affect: Mood normal.     ED Results / Procedures / Treatments   Labs (all labs ordered are listed, but only abnormal results are displayed) Labs Reviewed - No data to display  EKG None  Radiology No results found.  Procedures Procedures    Medications Ordered in ED Medications - No data to display  ED Course/ Medical Decision Making/ A&P                                 Medical Decision Making Risk Prescription drug management.   This patient presents to the ED for concern of hand pain, this involves an extensive number of treatment options, and is a complaint that carries with it a high risk of complications and morbidity.  The differential diagnosis includes fracture, strain/pain, dislocation, ligamentous/tendinous injury, vascular compromise, subungual hematoma, paronychia, felon, other     Co morbidities that complicate the patient evaluation   See HPI     Additional history obtained:   Additional history obtained from EMR External records from outside source obtained and reviewed including hospital records     Lab Tests:   N/a     Imaging Studies ordered:   N/a     Cardiac Monitoring: / EKG:   N/a     Consultations Obtained:   N/a     Problem List / ED Course / Critical interventions / Medication management   Left thumb pain I ordered medication including lidocaine    Reevaluation of the patient after these medicines showed that the patient improved I have reviewed the patients home medicines and have made adjustments as needed     Social Determinants of Health:   Former cigarette use.  Denies illicit  drug use.     Test / Admission - Considered:   Left thumb pain Vitals signs within normal range and stable throughout visit. 23 year old female presents emergency department with complaints of left thumb pain.  Patient originally suffered injury on 03/31/2024.  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  States the machine came down on her left thumb causing pain.  Was seen in the ED on the day of initial injury and had x-ray that was reassuring.  Since then, patient  has noted continued swelling as well as pain on the right side of her nailbed.  States the area began to drain more after ED visit yesterday.  Noticed purulent drainage.  States she had relief of swelling as well as pressure as area drained. On exam, area of palpable fluctuance appreciated lateral aspect of nailbed of left thumb.  No expressible purulent drainage appreciated. X-ray obtained 7 days ago was negative for any acute osseous abnormality.  Symptoms seem concerning for paronychia.  Given adequate drainage in the ED without incision needed, will defer any I&D given that she had 1 performed yesterday.  Patient was not placed on antibiotics at time so we will begin antibiotics.  Recommend continued warm soaks/compresses.  Will recommend follow-up with PCP in the outpatient setting for reassessment.  Treatment plan discussed with patient and she acknowledged understanding was agreeable to said plan.  Patient overall well-appearing, afebrile in no acute distress. Worrisome signs and symptoms were discussed with the patient, and the patient acknowledged understanding to return to the ED if noticed. Patient was stable upon discharge.         Final Clinical Impression(s) / ED Diagnoses Final diagnoses:  Pain of left thumb    Rx / DC Orders ED Discharge Orders          Ordered    doxycycline  (VIBRAMYCIN ) 100 MG capsule  2 times daily        04/08/24 1255              Hugo Butter, [place] 04/08/24 1342    Afton Horse T, DO 04/12/24 2250

## 2024-04-08 NOTE — Discharge Instructions (Signed)
 Continue to put warm soaks on your finger to help with drainage. Will place you on antibiotics for treatment of infection.

## 2024-04-08 NOTE — ED Notes (Signed)
 Pt to ED c/o left thumb pain, pt was here yesterday for the same., and had nailbed drained. Pt now reports "pus" at site

## 2024-04-09 ENCOUNTER — Encounter (HOSPITAL_BASED_OUTPATIENT_CLINIC_OR_DEPARTMENT_OTHER): Payer: Self-pay | Admitting: Emergency Medicine

## 2024-04-09 ENCOUNTER — Other Ambulatory Visit: Payer: Self-pay

## 2024-04-09 ENCOUNTER — Emergency Department (HOSPITAL_BASED_OUTPATIENT_CLINIC_OR_DEPARTMENT_OTHER)
Admission: EM | Admit: 2024-04-09 | Discharge: 2024-04-09 | Disposition: A | Discharge status: Home / Self Care | Payer: Self-pay | Attending: Emergency Medicine | Admitting: Emergency Medicine

## 2024-04-09 ENCOUNTER — Emergency Department (HOSPITAL_BASED_OUTPATIENT_CLINIC_OR_DEPARTMENT_OTHER): Payer: Self-pay

## 2024-04-09 DIAGNOSIS — J45909 Unspecified asthma, uncomplicated: Secondary | ICD-10-CM | POA: Insufficient documentation

## 2024-04-09 DIAGNOSIS — R102 Pelvic and perineal pain unspecified side: Secondary | ICD-10-CM

## 2024-04-09 DIAGNOSIS — Z5329 Procedure and treatment not carried out because of patient's decision for other reasons: Secondary | ICD-10-CM | POA: Insufficient documentation

## 2024-04-09 DIAGNOSIS — Z91010 Allergy to peanuts: Secondary | ICD-10-CM | POA: Insufficient documentation

## 2024-04-09 LAB — CBC
HCT: 45 % (ref 36.0–46.0)
Hemoglobin: 14.4 g/dL (ref 12.0–15.0)
MCH: 28.7 pg (ref 26.0–34.0)
MCHC: 32 g/dL (ref 30.0–36.0)
MCV: 89.8 fL (ref 80.0–100.0)
Platelets: 152 10*3/uL (ref 150–400)
RBC: 5.01 MIL/uL (ref 3.87–5.11)
RDW: 12.8 % (ref 11.5–15.5)
WBC: 9.7 10*3/uL (ref 4.0–10.5)
nRBC: 0 % (ref 0.0–0.2)

## 2024-04-09 LAB — COMPREHENSIVE METABOLIC PANEL WITH GFR
ALT: 17 U/L (ref 0–44)
AST: 26 U/L (ref 15–41)
Albumin: 4.2 g/dL (ref 3.5–5.0)
Alkaline Phosphatase: 84 U/L (ref 38–126)
Anion gap: 13 (ref 5–15)
BUN: 9 mg/dL (ref 6–20)
CO2: 24 mmol/L (ref 22–32)
Calcium: 9.5 mg/dL (ref 8.9–10.3)
Chloride: 104 mmol/L (ref 98–111)
Creatinine, Ser: 0.69 mg/dL (ref 0.44–1.00)
GFR, Estimated: >60 mL/min (ref >60)
Glucose, Bld: 84 mg/dL (ref 70–99)
Potassium: 3.9 mmol/L (ref 3.5–5.1)
Sodium: 140 mmol/L (ref 135–145)
Total Bilirubin: 0.3 mg/dL (ref 0.0–1.2)
Total Protein: 7.1 g/dL (ref 6.5–8.1)

## 2024-04-09 LAB — LIPASE, BLOOD: Lipase: 17 U/L (ref 11–51)

## 2024-04-09 LAB — HCG, SERUM, QUALITATIVE: Preg, Serum: NEGATIVE

## 2024-04-09 MED ORDER — DIPHENHYDRAMINE HCL 50 MG/ML IJ SOLN
25.0000 mg | Freq: Once | INTRAMUSCULAR | Status: AC
  Administered 2024-04-09: 25 mg via INTRAVENOUS
  Filled 2024-04-09: qty 1

## 2024-04-09 MED ORDER — IOHEXOL 300 MG/ML  SOLN: 100.0000 mL | Freq: Once | INTRAMUSCULAR | Status: DC | PRN

## 2024-04-09 MED ORDER — ONDANSETRON HCL 4 MG/2ML IJ SOLN
4.0000 mg | Freq: Once | INTRAMUSCULAR | Status: AC | PRN
  Administered 2024-04-09: 4 mg via INTRAVENOUS
  Filled 2024-04-09: qty 2

## 2024-04-09 MED ORDER — DROPERIDOL 2.5 MG/ML IJ SOLN
1.2500 mg | Freq: Once | INTRAMUSCULAR | Status: AC
  Administered 2024-04-09: 1.25 mg via INTRAVENOUS
  Filled 2024-04-09: qty 2

## 2024-04-09 NOTE — ED Notes (Signed)
 Pt is aware that a urine sample is needed, will try again later.

## 2024-04-09 NOTE — ED Notes (Signed)
 Pt made aware a urine sample is still needed. Unable to provide one at this time.

## 2024-04-09 NOTE — ED Triage Notes (Signed)
 Pt c/o lower abd pain with n/v, recent script for abx doxy, reports pain started after taking meds. Pt vomiting in triage

## 2024-04-09 NOTE — Discharge Instructions (Signed)
 Get help right away if: You have sudden severe pain. Your pain gets steadily worse. You have severe pain along with fever, nausea, vomiting, or excessive sweating. You lose consciousness. These symptoms may represent a serious problem that is an emergency. Do not wait to see if the symptoms will go away. Get medical help right away. Call your local emergency services (911 in the [place] Do not drive yourself to the hospital.

## 2024-04-09 NOTE — ED Notes (Signed)
 Pt stated she has to go get kids and has to leave now. Provider was made aware and went to speak to Pt. Provider advised Pt is leaving and have her sign AMA form in her chart. Pt agreed and signed AMA form.

## 2024-04-09 NOTE — ED Provider Notes (Signed)
 [place] EMERGENCY DEPARTMENT AT [facility] Provider Note   CSN: [id number] Arrival date & time: 04/09/24  1310     History  Chief Complaint  Patient presents with   Abdominal Pain    Kristie Mcintosh is a 23 y.o. female who presents for lower abdominal pain.  Her significant other reports that it began just after taking doxycycline  the more this morning.  Patient was seen yesterday for pain of the left thumb, started on doxycycline  for questionable infection.  She has been seen several times in the emergency department for thumb pain.  It is difficult to obtain history from the patient at this time as she is screaming crying and writhing around on the bed.  Patient states that her pain is in the lower quadrants of her abdomen is started on the right but now she feels it in the left as well.  Last menstrual period was "at the beginning of this month."   Abdominal Pain      Home Medications Prior to Admission medications   Medication Sig Start Date End Date Taking? Authorizing Provider  metroNIDAZOLE  (METROGEL ) 0.75 % vaginal gel Place 1 Applicatorful vaginally at bedtime. 12/21/22   Wendelyn Halter, MD  cyclobenzaprine  (FLEXERIL ) 10 MG tablet Take 1 tablet (10 mg total) by mouth 2 (two) times daily as needed for muscle spasms. Patient not taking: Reported on 11/21/2022 07/22/22   Arnie Bibber, MD  doxycycline  (VIBRAMYCIN ) 100 MG capsule Take 1 capsule (100 mg total) by mouth 2 (two) times daily. 04/08/24   [place] Butter, PA  etonogestrel -ethinyl estradiol  (NUVARING) 0.12-0.015 MG/24HR vaginal ring Insert vaginally and leave in place for 3 consecutive weeks, then remove for 1 week. 11/21/22   Tayte Childers Abler, MD  hydrocortisone  (ANUSOL -HC) 2.5 % rectal cream Place 1 application. rectally 2 (two) times daily. Patient not taking: Reported on 11/21/2022 03/03/22   Charmayne Cooper, MD  ibuprofen  (ADVIL ) 600 MG tablet Take 1 tablet (600 mg total) by mouth every 6 (six)  hours as needed. 04/07/24   Isola Butter, PA  metroNIDAZOLE  (FLAGYL ) 500 MG tablet Take 1 tablet (500 mg total) by mouth 2 (two) times daily. 12/25/22   Constant, Peggy, MD  polyethylene glycol powder (GLYCOLAX /MIRALAX ) 17 GM/SCOOP powder Take 17 g by mouth daily. Patient not taking: Reported on 11/21/2022 03/03/22   Charmayne Cooper, MD  witch hazel-glycerin  (TUCKS) pad Apply 1 application. topically as needed for itching. Patient not taking: Reported on 11/21/2022 03/03/22   Charmayne Cooper, MD  dicyclomine  (BENTYL ) 20 MG tablet Take 1 tablet (20 mg total) by mouth 2 (two) times daily. 10/04/19 04/05/20  Everlyn Hockey, PA-C  famotidine  (PEPCID ) 20 MG tablet Take 1 tablet (20 mg total) by mouth 2 (two) times daily. 10/04/19 04/05/20  Kehrli, Kelsey F, PA-C      Allergies    Peanuts [peanut oil], Penicillin g, and Promethazine     Review of Systems   Review of Systems  Gastrointestinal:  Positive for abdominal pain.    Physical Exam Updated Vital Signs BP 114/68 (BP Location: Right Arm)   Pulse 60   Temp 98.2 F (36.8 C) (Oral)   Resp 18   Wt 77 kg   LMP 03/27/2024   SpO2 99%   BMI 34.29 kg/m  Physical Exam Vitals and nursing note reviewed.  Constitutional:      General: She is not in acute distress.    Appearance: She is well-developed. She is not diaphoretic.  HENT:     Head: Normocephalic and atraumatic.     Right Ear: External ear normal.     Left Ear: External ear normal.     Nose: Nose normal.     Mouth/Throat:     Mouth: Mucous membranes are moist.  Eyes:     General: No scleral icterus.    Conjunctiva/sclera: Conjunctivae normal.  Cardiovascular:     Rate and Rhythm: Normal rate and regular rhythm.     Heart sounds: Normal heart sounds. No murmur heard.    No friction rub. No gallop.  Pulmonary:     Effort: Pulmonary effort is normal. No respiratory distress.     Breath sounds: Normal breath sounds.  Abdominal:     General: Bowel sounds are normal. There  is no distension.     Palpations: Abdomen is soft. There is no mass.     Tenderness: There is abdominal tenderness in the right lower quadrant, suprapubic area and left lower quadrant. There is no guarding.  Musculoskeletal:     Cervical back: Normal range of motion.  Skin:    General: Skin is warm and dry.  Neurological:     Mental Status: She is alert and oriented to person, place, and time.  Psychiatric:        Behavior: Behavior normal.     ED Results / Procedures / Treatments   Labs (all labs ordered are listed, but only abnormal results are displayed) Labs Reviewed  CBC  HCG, SERUM, QUALITATIVE  COMPREHENSIVE METABOLIC PANEL WITH GFR  LIPASE, BLOOD  URINALYSIS, ROUTINE W REFLEX MICROSCOPIC    EKG None  Radiology No results found.  Procedures Procedures    Medications Ordered in ED Medications  iohexol  (OMNIPAQUE ) 300 MG/ML solution 100 mL (has no administration in time range)  ondansetron  (ZOFRAN ) injection 4 mg (4 mg Intravenous Given 04/09/24 1347)  droperidol  (INAPSINE ) 2.5 MG/ML injection 1.25 mg (1.25 mg Intravenous Given 04/09/24 1413)  diphenhydrAMINE  (BENADRYL ) injection 25 mg (25 mg Intravenous Given 04/09/24 1413)    ED Course/ Medical Decision Making/ A&P                                Medical Decision Making Amount and/or Complexity of Data Reviewed Labs: ordered. Decision-making details documented in ED Course. Radiology: ordered.  Risk Prescription drug management.   This patient presents to the ED for concern of lower abdominal pain, this involves an extensive number of treatment options, and is a complaint that carries with it a high risk of complications and morbidity.  Differential diagnosis of her lower abdominal considerations include pelvic inflammatory disease, ectopic pregnancy, appendicitis, urinary calculi, primary dysmenorrhea, septic abortion, ruptured ovarian cyst or tumor, ovarian torsion, tubo-ovarian abscess, degeneration of  fibroid, endometriosis, diverticulitis, cystitis.  Clinical Course as of 04/09/24 1716  Fri Apr 09, 2024  1425 Patient evaluated at bedside.  Given IV Benadryl  and droperidol  for her pain symptoms.  She seems to be resting more comfortably.  Patient appears to be significantly in pain and is complaining of lower abdominal pain.  Initial plan is the medications I have already given to help decrease patient's discomfort and improve my ability to examine her.  Patient will also be getting a CT scan of the abdomen pelvis.  Will consider further evaluation with pelvic examination and Doppler study to rule out potential ovarian torsion if there are no significant findings on CT scan.  I do not  think that the patient's symptoms are related to taking doxycycline  as it should not cause sudden onset of lower quadrant abdominal pain. [AH]  1524 hCG, serum, qualitative [AH]  1524 CBC [AH]  1524 WBC: 9.7 Cbc unremarkable, negative HCG [AH]  1713 Patient is wanting to leave AGAINST MEDICAL ADVICE. She has capacity to make this decision. She is no longer in any pain after a dose of droperidol  and Benadryl .  I again do not think this had anything to do with her doxycycline  however she had a paronychia which was drained and likely does not need to take it anyway.  Is no evidence of cellulitis on the finger.  [AH]  1714 Date: 04/09/2024 Patient: Kristie Mcintosh  Attending Provider:Travis Linder Revere, MD; Jshaun Abernathy,PA-C   Kiona K Pike or her authorized caregiver has made the decision for the patient to leave the emergency department against the advice of Dasia Guerrier,PA-C.  She or her authorized caregiver has been informed and understands the inherent risks, including death.  She or her authorized caregiver has decided to accept the responsibility for this decision. Joyce K Platas and all necessary parties have been advised that she may return for further evaluation or treatment. Her condition at time of discharge  was Stable.  Arnesia K Neyra had current vital signs as follows:  @V @   Laneta K Radford or her authorized caregiver has signed the Leaving Against Medical Advice form prior to leaving the department.  Tama Fails 04/09/2024     [AH]    Clinical Course User Index [AH] Demetrion Wesby, PA-C     Co morbidities:   has a past medical history of Abdominal pain affecting pregnancy (05/16/2017), Amenorrhea (05/16/2017), Asthma, Bacterial vaginitis (05/16/2017), Headache(784.0), Normal labor (12/26/2020), and Obesity.   Social Determinants of Health:   SDOH Screenings   Depression (PHQ2-9): Medium Risk (06/15/2020)  Tobacco Use: Medium Risk (04/09/2024)     Additional history:  {Additional history obtained from his ED visit and patient's significant other at bedside   Lab Tests:  I Ordered, and personally interpreted labs.  The pertinent results include:   Labs without any abnormality Final Clinical Impression(s) / ED Diagnoses Final diagnoses:  Pelvic pain    Rx / DC Orders ED Discharge Orders     None         Tama Fails, PA-C 04/12/24 1446    Rolinda Climes, DO 04/12/24 1600

## 2024-04-27 ENCOUNTER — Emergency Department (HOSPITAL_BASED_OUTPATIENT_CLINIC_OR_DEPARTMENT_OTHER)
Admission: EM | Admit: 2024-04-27 | Discharge: 2024-04-27 | Disposition: A | Discharge status: Home / Self Care | Payer: Worker's Compensation | Attending: Emergency Medicine | Admitting: Emergency Medicine

## 2024-04-27 ENCOUNTER — Encounter (HOSPITAL_BASED_OUTPATIENT_CLINIC_OR_DEPARTMENT_OTHER): Payer: Self-pay

## 2024-04-27 ENCOUNTER — Other Ambulatory Visit: Payer: Self-pay

## 2024-04-27 DIAGNOSIS — S6991XA Unspecified injury of right wrist, hand and finger(s), initial encounter: Secondary | ICD-10-CM | POA: Insufficient documentation

## 2024-04-27 DIAGNOSIS — Z91010 Allergy to peanuts: Secondary | ICD-10-CM | POA: Diagnosis not present

## 2024-04-27 DIAGNOSIS — X58XXXA Exposure to other specified factors, initial encounter: Secondary | ICD-10-CM | POA: Insufficient documentation

## 2024-04-27 DIAGNOSIS — Y990 Civilian activity done for income or pay: Secondary | ICD-10-CM | POA: Insufficient documentation

## 2024-04-27 LAB — COMPREHENSIVE METABOLIC PANEL WITH GFR
ALT: 15 U/L (ref 0–44)
AST: 17 U/L (ref 15–41)
Albumin: 4.3 g/dL (ref 3.5–5.0)
Alkaline Phosphatase: 75 U/L (ref 38–126)
Anion gap: 12 (ref 5–15)
BUN: 12 mg/dL (ref 6–20)
CO2: 21 mmol/L — ABNORMAL LOW (ref 22–32)
Calcium: 9.3 mg/dL (ref 8.9–10.3)
Chloride: 105 mmol/L (ref 98–111)
Creatinine, Ser: 0.66 mg/dL (ref 0.44–1.00)
GFR, Estimated: >60 mL/min (ref >60)
Glucose, Bld: 115 mg/dL — ABNORMAL HIGH (ref 70–99)
Potassium: 4 mmol/L (ref 3.5–5.1)
Sodium: 138 mmol/L (ref 135–145)
Total Bilirubin: 0.4 mg/dL (ref 0.0–1.2)
Total Protein: 7.4 g/dL (ref 6.5–8.1)

## 2024-04-27 LAB — CBC WITH DIFFERENTIAL/PLATELET
Abs Immature Granulocytes: 0.02 10*3/uL (ref 0.00–0.07)
Basophils Absolute: 0 10*3/uL (ref 0.0–0.1)
Basophils Relative: 1 %
Eosinophils Absolute: 0.2 10*3/uL (ref 0.0–0.5)
Eosinophils Relative: 3 %
HCT: 39.6 % (ref 36.0–46.0)
Hemoglobin: 13 g/dL (ref 12.0–15.0)
Immature Granulocytes: 0 %
Lymphocytes Relative: 38 %
Lymphs Abs: 2.3 10*3/uL (ref 0.7–4.0)
MCH: 29.4 pg (ref 26.0–34.0)
MCHC: 32.8 g/dL (ref 30.0–36.0)
MCV: 89.6 fL (ref 80.0–100.0)
Monocytes Absolute: 0.4 10*3/uL (ref 0.1–1.0)
Monocytes Relative: 7 %
Neutro Abs: 3.1 10*3/uL (ref 1.7–7.7)
Neutrophils Relative %: 51 %
Platelets: 234 10*3/uL (ref 150–400)
RBC: 4.42 MIL/uL (ref 3.87–5.11)
RDW: 12.6 % (ref 11.5–15.5)
WBC: 6 10*3/uL (ref 4.0–10.5)
nRBC: 0 % (ref 0.0–0.2)

## 2024-04-27 MED ORDER — MUPIROCIN CALCIUM 2 % EX CREA: 1.0000 | TOPICAL_CREAM | Freq: Two times a day (BID) | CUTANEOUS | 0 refills | Status: DC

## 2024-04-27 NOTE — ED Provider Notes (Signed)
 [place] EMERGENCY DEPARTMENT AT [facility] Provider Note   CSN: [id number] Arrival date & time: 04/27/24  1503     History  Chief Complaint  Patient presents with   Finger Injury    5/7    Henritta K Finks is a 23 y.o. female.  HPI   Patient has history of a paronychia that was treated on May 7.  Patient states this started after an injury while at work.  Patient states she was treated with antibiotics.  She has completed those.  The other day while she was in [place] she started to notice some drainage again that she was able to express.  Patient has not seen anyone since the initial treatment.  Work was not sure when she could return to full activity so she came to the ED to be rechecked.  She denies any fevers or chills.  Home Medications Prior to Admission medications   Medication Sig Start Date End Date Taking? Authorizing Provider  metroNIDAZOLE  (METROGEL ) 0.75 % vaginal gel Place 1 Applicatorful vaginally at bedtime. 12/21/22   Wendelyn Halter, MD  mupirocin cream (BACTROBAN) 2 % Apply 1 Application topically 2 (two) times daily. 04/27/24  Yes Trish Furl, MD  cyclobenzaprine  (FLEXERIL ) 10 MG tablet Take 1 tablet (10 mg total) by mouth 2 (two) times daily as needed for muscle spasms. Patient not taking: Reported on 11/21/2022 07/22/22   Arnie Bibber, MD  doxycycline  (VIBRAMYCIN ) 100 MG capsule Take 1 capsule (100 mg total) by mouth 2 (two) times daily. 04/08/24   [place] Butter, PA  etonogestrel -ethinyl estradiol  (NUVARING) 0.12-0.015 MG/24HR vaginal ring Insert vaginally and leave in place for 3 consecutive weeks, then remove for 1 week. 11/21/22   Abigail Abler, MD  hydrocortisone  (ANUSOL -HC) 2.5 % rectal cream Place 1 application. rectally 2 (two) times daily. Patient not taking: Reported on 11/21/2022 03/03/22   Charmayne Cooper, MD  ibuprofen  (ADVIL ) 600 MG tablet Take 1 tablet (600 mg total) by mouth every 6 (six) hours as needed. 04/07/24   D[place]zell Butter, PA  metroNIDAZOLE  (FLAGYL ) 500 MG tablet Take 1 tablet (500 mg total) by mouth 2 (two) times daily. 12/25/22   Constant, Peggy, MD  polyethylene glycol powder (GLYCOLAX /MIRALAX ) 17 GM/SCOOP powder Take 17 g by mouth daily. Patient not taking: Reported on 11/21/2022 03/03/22   Charmayne Cooper, MD  witch hazel-glycerin  (TUCKS) pad Apply 1 application. topically as needed for itching. Patient not taking: Reported on 11/21/2022 03/03/22   Charmayne Cooper, MD  dicyclomine  (BENTYL ) 20 MG tablet Take 1 tablet (20 mg total) by mouth 2 (two) times daily. 10/04/19 04/05/20  Everlyn Hockey, PA-C  famotidine  (PEPCID ) 20 MG tablet Take 1 tablet (20 mg total) by mouth 2 (two) times daily. 10/04/19 04/05/20  Everlyn Hockey, PA-C      Allergies    Peanuts [peanut oil], Penicillin g, and Promethazine     Review of Systems   Review of Systems  Physical Exam Updated Vital Signs BP (!) 135/92 (BP Location: Right Arm)   Pulse 75   Temp 98.1 F (36.7 C)   Resp 18   LMP 03/27/2024   SpO2 97%  Physical Exam Vitals and nursing note reviewed.  Constitutional:      General: She is not in acute distress.    Appearance: She is well-developed.  HENT:     Head: Normocephalic and atraumatic.     Right Ear: External ear normal.     Left Ear:  External ear normal.  Eyes:     General: No scleral icterus.       Right eye: No discharge.        Left eye: No discharge.     Conjunctiva/sclera: Conjunctivae normal.  Neck:     Trachea: No tracheal deviation.  Cardiovascular:     Rate and Rhythm: Normal rate.  Pulmonary:     Effort: Pulmonary effort is normal. No respiratory distress.     Breath sounds: No stridor.  Abdominal:     General: There is no distension.  Musculoskeletal:        General: No swelling or deformity.     Cervical back: Neck supple.     Comments: Mild tenderness palpation right thumb ulnar aspect of the cuticle, no erythema no induration, no purulence, cuticle does appear  somewhat ragged  Skin:    General: Skin is warm and dry.     Findings: No rash.  Neurological:     Mental Status: She is alert. Mental status is at baseline.     Cranial Nerves: No dysarthria or facial asymmetry.     Motor: No seizure activity.     ED Results / Procedures / Treatments   Labs (all labs ordered are listed, but only abnormal results are displayed) Labs Reviewed  COMPREHENSIVE METABOLIC PANEL WITH GFR - Abnormal; Notable for the following components:      Result Value   CO2 21 (*)    Glucose, Bld 115 (*)    All other components within normal limits  CBC WITH DIFFERENTIAL/PLATELET    EKG None  Radiology No results found.  Procedures Procedures    Medications Ordered in ED Medications - No data to display  ED Course/ Medical Decision Making/ A&P Clinical Course as of 04/27/24 1623  Tue Apr 27, 2024  1623 CBC metabolic panel normal [JK]    Clinical Course User Index [JK] Trish Furl, MD                                 Medical Decision Making Problems Addressed: Injury of finger of right hand, initial encounter: self-limited or minor problem  Amount and/or Complexity of Data Reviewed Labs: ordered. Decision-making details documented in ED Course.  Risk Prescription drug management.   No signs of recurrent paronychia or felon.  No signs of cellulitis.  Will have patient continue warm soaks.  Will have her apply antibiotic ointment.  Evaluation and diagnostic testing in the emergency department does not suggest an emergent condition requiring admission or immediate intervention beyond what has been performed at this time.  The patient is safe for discharge and has been instructed to return immediately for worsening symptoms, change in symptoms or any other concerns.        Final Clinical Impression(s) / ED Diagnoses Final diagnoses:  Injury of finger of right hand, initial encounter    Rx / DC Orders ED Discharge Orders          Ordered     mupirocin cream (BACTROBAN) 2 %  2 times daily        04/27/24 1619              Trish Furl, MD 04/27/24 1623

## 2024-04-27 NOTE — Discharge Instructions (Signed)
 Apply the antibiotic ointment to the wound daily.  Continue the warm soaks you have been doing.  Consider seeing an occupational health doctor or hand specialist for further evaluation if the symptoms persists

## 2024-04-27 NOTE — ED Triage Notes (Signed)
 Pt c/o paronychia onset 5/7, after initially being hurt at work. Finished abx Advises yesterday she was in the shower "& It started pussing"  Advises that work wants her back, but she is not ready r/t pain & concern for continued infection

## 2024-06-03 ENCOUNTER — Inpatient Hospital Stay (HOSPITAL_COMMUNITY)
Admission: AD | Admit: 2024-06-03 | Discharge: 2024-06-03 | Disposition: A | Discharge status: Home / Self Care | Payer: Self-pay | Attending: Obstetrics & Gynecology | Admitting: Obstetrics & Gynecology

## 2024-06-03 ENCOUNTER — Ambulatory Visit: Payer: Self-pay | Admitting: Obstetrics and Gynecology

## 2024-06-03 ENCOUNTER — Encounter (HOSPITAL_COMMUNITY): Payer: Self-pay | Admitting: Obstetrics & Gynecology

## 2024-06-03 DIAGNOSIS — R109 Unspecified abdominal pain: Secondary | ICD-10-CM | POA: Insufficient documentation

## 2024-06-03 DIAGNOSIS — N912 Amenorrhea, unspecified: Secondary | ICD-10-CM | POA: Insufficient documentation

## 2024-06-03 DIAGNOSIS — Z3202 Encounter for pregnancy test, result negative: Secondary | ICD-10-CM | POA: Insufficient documentation

## 2024-06-03 LAB — POCT PREGNANCY, URINE: Preg Test, Ur: NEGATIVE

## 2024-06-03 LAB — HCG, QUANTITATIVE, PREGNANCY: hCG, Beta Chain, Quant, S: 1 m[IU]/mL (ref <5)

## 2024-06-03 NOTE — MAU Provider Note (Signed)
 Event Date/Time   First Provider Initiated Contact with Patient 06/03/24 1729      S [name] is a 23 y.o. [id number] patient who presents to MAU today with complaint of LMP of 04/25/2024 x 6-7 days. She had unprotected SI on 05/09/2024. She started spotting from 05/22/2024-05/24/2024; which was not normal for it to be her cycle. She is concerned that she could be pregnant. She has not taken a HPT.  O BP 120/74   Pulse 68   Temp 98.8 F (37.1 C)   Resp 18   LMP 05/22/2024  Physical Exam Vitals and nursing note reviewed.  Constitutional:      Appearance: Normal appearance. She is obese.  Cardiovascular:     Rate and Rhythm: Normal rate.  Pulmonary:     Effort: Pulmonary effort is normal.  Musculoskeletal:        General: Normal range of motion.  Skin:    General: Skin is warm and dry.  Neurological:     Mental Status: She is alert and oriented to person, place, and time.  Psychiatric:        Mood and Affect: Mood is anxious. Affect is tearful.        Behavior: Behavior normal.        Thought Content: Thought content normal.        Judgment: Judgment normal.    Results for orders placed or performed during the hospital encounter of 06/03/24 (from the past 24 hours)  Pregnancy, urine POC     Status: None   Collection Time: 06/03/24  5:11 PM  Result Value Ref Range   Preg Test, Ur NEGATIVE NEGATIVE  hCG, quantitative, pregnancy     Status: None   Collection Time: 06/03/24  6:13 PM  Result Value Ref Range   hCG, Beta Chain, Quant, S 1 <5 mIU/mL    A Medical screening exam complete 1. Amenorrhea (Primary)  2. Abdominal cramping    P Discharge from MAU in stable condition List of options for follow-up given for birth control options Warning signs for worsening condition that would warrant emergency follow-up discussed Patient may return to MAU as needed for pregnancy related issues   MyChart message sent to patient to notify of NEGATIVE HCG results.  Letha Renshaw, CNM 06/03/2024 5:41 PM

## 2024-06-03 NOTE — MAU Note (Signed)
 Pt was worried she might be pregnant had a period beginning of June  had intercourse June 15-16. Had a light period on June 28-30. 9 early for her period. Tested last week it was negative and ours is negative today.  Reports some lower abd cramping like her period wants to come on. . Does not want to be pregnant.

## 2024-06-04 ENCOUNTER — Telehealth: Payer: Self-pay | Admitting: Obstetrics and Gynecology

## 2024-06-04 NOTE — Telephone Encounter (Signed)
 No answer. Not way to leave message. MyChart message sent to notify patient of lab results.  Mattingly Fountaine, CNM

## 2024-07-27 ENCOUNTER — Ambulatory Visit (INDEPENDENT_AMBULATORY_CARE_PROVIDER_SITE_OTHER): Payer: Self-pay

## 2024-07-27 VITALS — BP 112/76 | HR 79

## 2024-07-27 DIAGNOSIS — Z3200 Encounter for pregnancy test, result unknown: Secondary | ICD-10-CM

## 2024-07-27 DIAGNOSIS — Z3201 Encounter for pregnancy test, result positive: Secondary | ICD-10-CM

## 2024-07-27 LAB — POCT URINE PREGNANCY: Preg Test, Ur: POSITIVE — AB

## 2024-07-27 NOTE — Progress Notes (Signed)
 Kristie Mcintosh here for a UPT. Pt had a positive upt at home. LMP is 06/20/24.     UPT in office Positive.    Reviewed medications and informed to start a PNV, if not already. Pt to follow up in 3 weeks for New OB visit.

## 2024-08-02 ENCOUNTER — Other Ambulatory Visit

## 2024-08-23 ENCOUNTER — Ambulatory Visit (INDEPENDENT_AMBULATORY_CARE_PROVIDER_SITE_OTHER): Payer: Self-pay | Admitting: *Deleted

## 2024-08-23 ENCOUNTER — Other Ambulatory Visit (HOSPITAL_COMMUNITY)
Admission: RE | Admit: 2024-08-23 | Discharge: 2024-08-23 | Disposition: A | Discharge status: Home / Self Care | Source: Ambulatory Visit | Attending: Obstetrics and Gynecology | Admitting: Obstetrics and Gynecology

## 2024-08-23 ENCOUNTER — Other Ambulatory Visit (INDEPENDENT_AMBULATORY_CARE_PROVIDER_SITE_OTHER): Payer: Self-pay

## 2024-08-23 VITALS — BP 114/77 | HR 95 | Wt 167.8 lb

## 2024-08-23 DIAGNOSIS — Z3480 Encounter for supervision of other normal pregnancy, unspecified trimester: Secondary | ICD-10-CM | POA: Insufficient documentation

## 2024-08-23 DIAGNOSIS — Z3481 Encounter for supervision of other normal pregnancy, first trimester: Secondary | ICD-10-CM

## 2024-08-23 DIAGNOSIS — Z3A09 9 weeks gestation of pregnancy: Secondary | ICD-10-CM

## 2024-08-23 DIAGNOSIS — O0990 Supervision of high risk pregnancy, unspecified, unspecified trimester: Secondary | ICD-10-CM | POA: Insufficient documentation

## 2024-08-23 DIAGNOSIS — O3680X0 Pregnancy with inconclusive fetal viability, not applicable or unspecified: Secondary | ICD-10-CM

## 2024-08-23 MED ORDER — VITAFOL GUMMIES 3.33-0.333-34.8 MG PO CHEW: 3.0000 | CHEWABLE_TABLET | Freq: Every day | ORAL | 11 refills | Status: AC

## 2024-08-23 MED ORDER — BLOOD PRESSURE KIT DEVI: 1.0000 | 0 refills | Status: DC

## 2024-08-23 NOTE — Patient Instructions (Signed)

## 2024-08-23 NOTE — Progress Notes (Signed)
 New OB Intake  I connected with Kristie Mcintosh  on 08/23/24 at  8:15 AM EDT by In Person Visit and verified that I am speaking with the correct person using two identifiers. Nurse is located at [facility] and pt is located at [place].  I discussed the limitations, risks, security and privacy concerns of performing an evaluation and management service by telephone and the availability of in person appointments. I also discussed with the patient that there may be a patient responsible charge related to this service. The patient expressed understanding and agreed to proceed.  I explained I am completing New OB Intake today. We discussed EDD of 03/27/25 based on LMP of 06/20/24. Pt is G3P2002. I reviewed her allergies, medications and Medical/Surgical/OB history.    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 Discussed the nature of our practice with multiple providers including residents and students as well as female and female providers. Due to the size of the practice, the delivering provider may not be the same as those providing prenatal care.   Patient is interested in water birth.  MyChart/Babyscripts MyChart access verified. I explained pt will have some visits in office and some virtually. Babyscripts instructions given and order placed. Patient verifies receipt of registration text/e-mail. Account successfully created and app downloaded. If patient is a candidate for  Optimized scheduling, add to sticky note.   Blood Pressure Cuff/Weight Scale Blood pressure cuff ordered for patient to pick-up from [employer]. Explained after first prenatal appt pt will check weekly and document in Babyscripts. Patient does not have weight scale; patient may purchase if they desire to track weight weekly in Babyscripts.  Anatomy US  Explained first scheduled US  will be around 19 weeks. Anatomy US  scheduled for TBD at TBD.   Is patient a candidate for Babyscripts Optimization? No, due to Risk Factors   First visit review I reviewed new OB appt with patient. Explained pt will be seen by Dr. Herchel at first visit. Discussed [place] genetic screening with patient. Requests Panorama. Routine prenatal labs OB Urine, GC/CC collected at today's visit. Initial OB labs deferred.   Last Pap Diagnosis  Date Value Ref Range Status  11/21/2022 (A)  Final   - Atypical squamous cells of undetermined significance (ASC-US )    Rocky CHRISTELLA Ober, RN 08/23/2024  8:33 AM

## 2024-08-24 LAB — CERVICOVAGINAL ANCILLARY ONLY
Chlamydia: NEGATIVE
Comment: NEGATIVE
Comment: NORMAL
Neisseria Gonorrhea: NEGATIVE

## 2024-09-01 ENCOUNTER — Other Ambulatory Visit

## 2024-09-01 DIAGNOSIS — Z3481 Encounter for supervision of other normal pregnancy, first trimester: Secondary | ICD-10-CM

## 2024-09-07 LAB — PANORAMA PRENATAL TEST FULL PANEL:PANORAMA TEST PLUS 5 ADDITIONAL MICRODELETIONS: FETAL FRACTION: 7

## 2024-09-08 ENCOUNTER — Ambulatory Visit: Payer: Self-pay | Admitting: Obstetrics and Gynecology

## 2024-09-14 ENCOUNTER — Encounter: Admitting: Obstetrics & Gynecology

## 2024-09-14 ENCOUNTER — Encounter: Payer: Self-pay | Admitting: Obstetrics & Gynecology

## 2024-09-14 DIAGNOSIS — O99210 Obesity complicating pregnancy, unspecified trimester: Secondary | ICD-10-CM | POA: Insufficient documentation

## 2024-09-21 ENCOUNTER — Encounter: Payer: Self-pay | Admitting: Advanced Practice Midwife

## 2024-09-21 ENCOUNTER — Ambulatory Visit: Admitting: Advanced Practice Midwife

## 2024-09-21 VITALS — Wt 166.6 lb

## 2024-09-21 DIAGNOSIS — Z3480 Encounter for supervision of other normal pregnancy, unspecified trimester: Secondary | ICD-10-CM

## 2024-09-21 DIAGNOSIS — O0990 Supervision of high risk pregnancy, unspecified, unspecified trimester: Secondary | ICD-10-CM

## 2024-09-21 DIAGNOSIS — Z659 Problem related to unspecified psychosocial circumstances: Secondary | ICD-10-CM

## 2024-09-21 DIAGNOSIS — Z3A13 13 weeks gestation of pregnancy: Secondary | ICD-10-CM | POA: Diagnosis not present

## 2024-09-21 DIAGNOSIS — O219 Vomiting of pregnancy, unspecified: Secondary | ICD-10-CM | POA: Diagnosis not present

## 2024-09-21 DIAGNOSIS — Z1331 Encounter for screening for depression: Secondary | ICD-10-CM | POA: Diagnosis not present

## 2024-09-21 MED ORDER — METOCLOPRAMIDE HCL 10 MG PO TABS: 10.0000 mg | ORAL_TABLET | Freq: Three times a day (TID) | ORAL | 2 refills | Status: AC | PRN

## 2024-09-21 MED ORDER — DOXYLAMINE-PYRIDOXINE 10-10 MG PO TBEC: DELAYED_RELEASE_TABLET | ORAL | 5 refills | Status: DC

## 2024-09-21 NOTE — Progress Notes (Signed)
 Pt presents for ROB visit. Pt c/o nausea and constipation. Using Marijuana for nausea   No issues with FOB at this time   PHQ9 and GAD7 elevated

## 2024-09-21 NOTE — Progress Notes (Signed)
 Subjective:   Kristie Mcintosh is a 23 y.o. G3P2002 at [redacted]w[redacted]d by LMP being seen today for her first obstetrical visit.  Her obstetrical history is significant for vaginal delivery at term x 2 and has Migraine without aura and without status migrainosus, not intractable; ADD (attention deficit disorder); Supervision of high risk pregnancy, antepartum; and Obesity in pregnancy, antepartum on their problem list.. Patient does intend to breast feed. Pregnancy history fully reviewed.  Patient reports no complaints.  HISTORY: OB History  Gravida Para Term Preterm AB Living  3 2 2  0 0 2  SAB IAB Ectopic Multiple Live Births  0 0 0 0 2    # Outcome Date GA Lbr Len/2nd Weight Sex Type Anes PTL Lv  3 Current           2 Term 12/26/20 [redacted]w[redacted]d / 00:09 5 lb 9.8 oz (2.546 kg) M Vag-Spont None  LIV     Birth Comments: WNL     Name: BRITANEE, VANBLARCOM     Apgar1: 8  Apgar5: 9  1 Term 01/07/18 [redacted]w[redacted]d 07:36 / 00:11 5 lb 12.4 oz (2.62 kg) M Vag-Spont EPI  LIV     Name: Enwright,BOY Lashai     Apgar1: 9  Apgar5: 9   Past Medical History:  Diagnosis Date   Abdominal pain affecting pregnancy 05/16/2017   ADD (attention deficit disorder) 08/11/2013   Amenorrhea 05/16/2017   Asthma    Bacterial vaginitis 05/16/2017   Circadian rhythm sleep disorder, 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   Headache(784.0)    Insomnia 08/11/2013   Migraine without aura and without status migrainosus, not intractable 08/11/2013   Normal labor 12/26/2020   Obesity    Tension headache 08/11/2013   Past Surgical History:  Procedure Laterality Date   ADENOIDECTOMY AND MYRINGOTOMY WITH TUBE PLACEMENT Bilateral 2001   WISDOM TOOTH EXTRACTION Bilateral    Family History  Problem Relation Age of Onset   Headache Mother    Heart disease Mother    Heart disease Father    Bipolar disorder Paternal Aunt    Migraines Maternal Grandmother    Depression Maternal Grandmother    Heart Problems Maternal Grandfather    Autism  Cousin        2 Maternal 1st Cousins have Autism   Hyperlipidemia Other    Diabetes Other    Heart disease Other    Hypertension Other    Social History   Tobacco Use   Smoking status: Never   Smokeless tobacco: Never  Vaping Use   Vaping status: Never Used  Substance Use Topics   Alcohol use: Not Currently    Comment: Occasionally   Drug use: Yes    Frequency: 7.0 times per week    Types: Marijuana   Allergies  Allergen Reactions   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   Promethazine  Hives and Nausea And Vomiting   Current Outpatient Medications on File Prior to Visit  Medication Sig Dispense Refill   Prenatal Vit-Fe Phos-FA-Omega (VITAFOL   GUMMIES) 3.33-0.333-34.8 MG CHEW Chew 3 tablets by mouth daily. 90 tablet 11   [DISCONTINUED] dicyclomine  (BENTYL ) 20 MG tablet Take 1 tablet (20 mg total) by mouth 2 (two) times daily. 20 tablet 0   [DISCONTINUED] famotidine  (PEPCID ) 20 MG tablet Take 1 tablet (20 mg total) by mouth 2 (two) times daily. 30 tablet 0   No current facility-administered medications on file prior to visit.    Exam   Vitals:   09/21/24 1505  Weight: 166 lb 9.6 oz (75.6 kg)   Fetal Heart Rate (bpm): 145  VS reviewed, nursing note reviewed,  Constitutional: well developed, well nourished, no distress HEENT: normocephalic CV: normal rate Pulm/chest wall: normal effort Abdomen: soft Neuro: alert and oriented x 3 Skin: warm, dry Psych: affect normal    Assessment:    Pregnancy: [id number] Patient Active Problem List   Diagnosis Date Noted   Obesity in pregnancy, antepartum 09/14/2024   Supervision of high risk pregnancy, antepartum 08/23/2024   Migraine without aura and without status migrainosus, not intractable 08/11/2013   ADD (attention deficit disorder) 08/11/2013     Plan:  1. Supervision of other normal pregnancy, antepartum (Primary) --Anticipatory guidance about next visits/weeks of pregnancy given.  - TSH Rfx on Abnormal to Free T4 - Comprehensive metabolic panel with GFR - HgB J8r - CBC/D/Plt+RPR+Rh+ABO+RubIgG... - Culture, OB Urine  2. [redacted] weeks gestation of pregnancy   3. Nausea and vomiting during pregnancy prior to [redacted] weeks gestation --Pt currently using marijuana.  Has poor appetite.  Discussed alternative medications as pt is motivated to cut back/quit.    - metoCLOPramide  (REGLAN ) 10 MG tablet; Take 1 tablet (10 mg total) by mouth 3 (three) times daily with meals as needed for nausea.  Dispense: 60 tablet; Refill: 2 - Doxylamine -Pyridoxine  (DICLEGIS ) 10-10 MG TBEC; Take 2 tabs at bedtime. If needed, add another tab in the morning. If needed, add another tab in the afternoon, up to 4 tabs/day.  Dispense: 100 tablet; Refill: 5  4. Positive screening for depression on 9-item Patient Health Questionnaire (PHQ-9)  - Amb ref to [facility])  5. Other social stressor --Pt reports that she initially planned a termination.  FOB wanted her to terminate. She decided not to go through with it and he is threatening to fight for custody of the baby and take him away.     Initial labs reviewed, additional labs drawn.  Continue prenatal vitamins. Reviewed NIPS results.   Ultrasound discussed; fetal anatomic survey: ordered. Problem list reviewed and updated. The nature of [facility] with multiple MDs and other Advanced Practice Providers was explained to patient; also emphasized  that residents, students are part of our team. Routine obstetric precautions reviewed. Return in about 4 weeks (around 10/19/2024) for Midwife preferred.   Olam Boards, CNM 09/21/24 5:56 PM

## 2024-09-22 LAB — COMPREHENSIVE METABOLIC PANEL WITH GFR
ALT: 16 IU/L (ref 0–32)
AST: 16 IU/L (ref 0–40)
Albumin: 4.2 g/dL (ref 4.0–5.0)
Alkaline Phosphatase: 72 IU/L (ref 41–116)
BUN/Creatinine Ratio: 9 (ref 9–23)
BUN: 5 mg/dL — ABNORMAL LOW (ref 6–20)
Bilirubin Total: 0.2 mg/dL (ref 0.0–1.2)
CO2: 20 mmol/L (ref 20–29)
Calcium: 9.6 mg/dL (ref 8.7–10.2)
Chloride: 100 mmol/L (ref 96–106)
Creatinine, Ser: 0.56 mg/dL — ABNORMAL LOW (ref 0.57–1.00)
Globulin, Total: 2.9 g/dL (ref 1.5–4.5)
Glucose: 90 mg/dL (ref 70–99)
Potassium: 3.9 mmol/L (ref 3.5–5.2)
Sodium: 134 mmol/L (ref 134–144)
Total Protein: 7.1 g/dL (ref 6.0–8.5)
eGFR: 130 mL/min/1.73 (ref >59)

## 2024-09-22 LAB — CBC/D/PLT+RPR+RH+ABO+RUBIGG...
Antibody Screen: NEGATIVE
Basophils Absolute: 0 x10E3/uL (ref 0.0–0.2)
Basos: 0 %
EOS (ABSOLUTE): 0.2 x10E3/uL (ref 0.0–0.4)
Eos: 2 %
HCV Ab: NONREACTIVE
HIV Screen 4th Generation wRfx: NONREACTIVE
Hematocrit: 37.9 % (ref 34.0–46.6)
Hemoglobin: 12.5 g/dL (ref 11.1–15.9)
Hepatitis B Surface Ag: NEGATIVE
Immature Grans (Abs): 0 x10E3/uL (ref 0.0–0.1)
Immature Granulocytes: 0 %
Lymphocytes Absolute: 2.1 x10E3/uL (ref 0.7–3.1)
Lymphs: 21 %
MCH: 30.1 pg (ref 26.6–33.0)
MCHC: 33 g/dL (ref 31.5–35.7)
MCV: 91 fL (ref 79–97)
Monocytes Absolute: 0.5 x10E3/uL (ref 0.1–0.9)
Monocytes: 5 %
Neutrophils Absolute: 7.3 x10E3/uL — ABNORMAL HIGH (ref 1.4–7.0)
Neutrophils: 72 %
Platelets: 219 x10E3/uL (ref 150–450)
RBC: 4.15 x10E6/uL (ref 3.77–5.28)
RDW: 12.5 % (ref 11.7–15.4)
RPR Ser Ql: NONREACTIVE
Rh Factor: POSITIVE
Rubella Antibodies, IGG: 1.59 {index} (ref >0.99)
WBC: 10.1 x10E3/uL (ref 3.4–10.8)

## 2024-09-22 LAB — HCV INTERPRETATION

## 2024-09-22 LAB — HEMOGLOBIN A1C
Est. average glucose Bld gHb Est-mCnc: 111 mg/dL
Hgb A1c MFr Bld: 5.5 % (ref 4.8–5.6)

## 2024-09-22 LAB — TSH RFX ON ABNORMAL TO FREE T4: TSH: 0.725 u[IU]/mL (ref 0.450–4.500)

## 2024-09-23 LAB — URINE CULTURE, OB REFLEX

## 2024-09-23 LAB — CULTURE, OB URINE

## 2024-10-02 ENCOUNTER — Other Ambulatory Visit: Payer: Self-pay | Admitting: Advanced Practice Midwife

## 2024-10-02 DIAGNOSIS — O219 Vomiting of pregnancy, unspecified: Secondary | ICD-10-CM

## 2024-10-02 MED ORDER — SCOPOLAMINE 1 MG/3DAYS TD PT72: 1.0000 | MEDICATED_PATCH | TRANSDERMAL | 2 refills | Status: AC

## 2024-10-02 MED ORDER — ONDANSETRON 4 MG PO TBDP: 4.0000 mg | ORAL_TABLET | Freq: Four times a day (QID) | ORAL | 2 refills | Status: DC | PRN

## 2024-10-19 ENCOUNTER — Ambulatory Visit: Admitting: Advanced Practice Midwife

## 2024-10-19 VITALS — BP 110/60 | HR 76 | Wt 164.0 lb

## 2024-10-19 DIAGNOSIS — Z1331 Encounter for screening for depression: Secondary | ICD-10-CM

## 2024-10-19 DIAGNOSIS — O219 Vomiting of pregnancy, unspecified: Secondary | ICD-10-CM

## 2024-10-19 DIAGNOSIS — Z3480 Encounter for supervision of other normal pregnancy, unspecified trimester: Secondary | ICD-10-CM

## 2024-10-19 DIAGNOSIS — O2612 Low weight gain in pregnancy, second trimester: Secondary | ICD-10-CM

## 2024-10-19 DIAGNOSIS — Z3A17 17 weeks gestation of pregnancy: Secondary | ICD-10-CM | POA: Diagnosis not present

## 2024-10-19 MED ORDER — ONDANSETRON 4 MG PO TBDP: 4.0000 mg | ORAL_TABLET | Freq: Four times a day (QID) | ORAL | 2 refills | Status: AC | PRN

## 2024-10-19 NOTE — Progress Notes (Signed)
 PRENATAL VISIT NOTE  Subjective:  Kristie Mcintosh is a 23 y.o. G3P2002 at [redacted]w[redacted]d being seen today for ongoing prenatal care.  She is currently monitored for the following issues for this low-risk pregnancy and has Migraine without aura and without status migrainosus, not intractable; ADD (attention deficit disorder); Supervision of high risk pregnancy, antepartum; and Obesity in pregnancy, antepartum on their problem list.  Patient reports no complaints.  Contractions: Not present. Vag. Bleeding: None.  Movement: Present. Denies leaking of fluid.   The following portions of the patient's history were reviewed and updated as appropriate: allergies, current medications, past family history, past medical history, past social history, past surgical history and problem list.   Objective:   Vitals:   10/19/24 1420  BP: 110/60  Pulse: 76  Weight: 164 lb (74.4 kg)    Fetal Status:  Fetal Heart Rate (bpm): 147   Movement: Present    General: Alert, oriented and cooperative. Patient is in no acute distress.  Skin: Skin is warm and dry. No rash noted.   Cardiovascular: Normal heart rate noted  Respiratory: Normal respiratory effort, no problems with respiration noted  Abdomen: Soft, gravid, appropriate for gestational age.  Pain/Pressure: Absent     Pelvic: Cervical exam deferred        Extremities: Normal range of motion.  Edema: None  Mental Status: Normal mood and affect. Normal behavior. Normal judgment and thought content.      09/21/2024    3:11 PM 06/15/2020    9:11 AM  Depression screen PHQ 2/9  Decreased Interest 3 3  Down, Depressed, Hopeless 2 0  PHQ - 2 Score 5 3  Altered sleeping 3 2  Tired, decreased energy 3 3  Change in appetite 0 0  Feeling bad or failure about yourself  0 0  Trouble concentrating 0 0  Moving slowly or fidgety/restless 0 0  Suicidal thoughts 0 0  PHQ-9 Score 11  8      Data saved with a previous flowsheet row definition        09/21/2024     3:14 PM 06/15/2020    9:13 AM  GAD 7 : Generalized Anxiety Score  Nervous, Anxious, on Edge 0 3  Control/stop worrying 0 0  Worry too much - different things 0 0  Trouble relaxing 3 0  Restless 3 0  Easily annoyed or irritable 3 3  Afraid - awful might happen 1 0  Total GAD 7 Score 10 6  Anxiety Difficulty  Not difficult at all    Assessment and Plan:  Pregnancy: G3P2002 at [redacted]w[redacted]d 1. Supervision of other normal pregnancy, antepartum (Primary) --Anticipatory guidance about next visits/weeks of pregnancy given.   2. Positive screening for depression on 9-item Patient Health Questionnaire (PHQ-9) --Pt reports she is doing well right now. Declines [facility] but aware of resources if needed.  3. [redacted] weeks gestation of pregnancy  5. Nausea and vomiting during pregnancy prior to [redacted] weeks gestation  - ondansetron  (ZOFRAN -ODT) 4 MG disintegrating tablet; Take 1 tablet (4 mg total) by mouth every 6 (six) hours as needed for nausea.  Dispense: 20 tablet; Refill: 2  6. Poor weight gain of pregnancy, second trimester --Pt with 8-10 lb weight loss in pregnancy.  Discussed diet. Pt with low appetite, having trouble eating meat she usually likes.  Recommend increasing protein, try liquid calories like smoothies/shakes/Ensure.   --F/U at next visit    Preterm labor symptoms and general obstetric precautions including but not limited  to vaginal bleeding, contractions, leaking of fluid and fetal movement were reviewed in detail with the patient. Please refer to After Visit Summary for other counseling recommendations.   Return in about 4 weeks (around 11/16/2024) for Midwife preferred, LOB.  Future Appointments  Date Time Provider Department Center  11/10/2024  8:00 AM [facility] PROVIDER 1 [facility]  11/10/2024  8:30 AM [facility] US3 [facility]  11/24/2024 10:15 AM Nicholaus Jorene BRAVO, PA-C [facility] None    Olam Boards, [place]

## 2024-11-01 DIAGNOSIS — F1290 Cannabis use, unspecified, uncomplicated: Secondary | ICD-10-CM | POA: Insufficient documentation

## 2024-11-10 ENCOUNTER — Other Ambulatory Visit: Attending: Obstetrics and Gynecology

## 2024-11-10 ENCOUNTER — Ambulatory Visit

## 2024-11-10 ENCOUNTER — Other Ambulatory Visit: Payer: Self-pay | Admitting: *Deleted

## 2024-11-10 VITALS — BP 115/53 | HR 82

## 2024-11-10 DIAGNOSIS — O43112 Circumvallate placenta, second trimester: Secondary | ICD-10-CM

## 2024-11-10 DIAGNOSIS — O0990 Supervision of high risk pregnancy, unspecified, unspecified trimester: Secondary | ICD-10-CM | POA: Insufficient documentation

## 2024-11-10 DIAGNOSIS — O99213 Obesity complicating pregnancy, third trimester: Secondary | ICD-10-CM | POA: Insufficient documentation

## 2024-11-10 DIAGNOSIS — Z3480 Encounter for supervision of other normal pregnancy, unspecified trimester: Secondary | ICD-10-CM | POA: Diagnosis not present

## 2024-11-10 DIAGNOSIS — F1290 Cannabis use, unspecified, uncomplicated: Secondary | ICD-10-CM

## 2024-11-10 DIAGNOSIS — O99320 Drug use complicating pregnancy, unspecified trimester: Secondary | ICD-10-CM | POA: Diagnosis present

## 2024-11-10 DIAGNOSIS — O99212 Obesity complicating pregnancy, second trimester: Secondary | ICD-10-CM | POA: Insufficient documentation

## 2024-11-10 DIAGNOSIS — Z3A20 20 weeks gestation of pregnancy: Secondary | ICD-10-CM | POA: Diagnosis not present

## 2024-11-10 DIAGNOSIS — O99210 Obesity complicating pregnancy, unspecified trimester: Secondary | ICD-10-CM | POA: Insufficient documentation

## 2024-11-10 DIAGNOSIS — O3503X0 Maternal care for (suspected) central nervous system malformation or damage in fetus, choroid plexus cysts, not applicable or unspecified: Secondary | ICD-10-CM

## 2024-11-10 NOTE — Progress Notes (Signed)
 MFM Consult Note  Kristie Mcintosh is currently at [redacted]w[redacted]d. She was seen today for a detailed fetal anatomy scan due to maternal obesity with a BMI of 34.3.[name]  She denies any significant past medical history and denies any problems in her current pregnancy.    She had a cell free DNA test earlier in her pregnancy which indicated a low risk for trisomy 23, 62, and 13. A female fetus is predicted.   Sonographic findings Single intrauterine pregnancy at 20w 3d. Fetal cardiac activity:  Observed and appears normal. Presentation: Cephalic. 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  The cerebellar vermis appeared within normal limits.   The fetal cardiac views and views of the fetal upper lip were limited today due to the fetal position. Fetal biometry shows the estimated fetal weight of 0 lb 14 oz, 396 grams (79%). Amniotic fluid: Within normal limits.  MVP: 6.41 cm. Placenta: Anterior.  A possible circumvallate placenta was noted on today's exam.  The patient was informed that anomalies may be missed due to technical limitations. If the fetus is in a suboptimal position or maternal habitus is increased, visualization of the fetus in the maternal uterus may be impaired.  Blake's pouch cyst  The patient was advised that a Blake's pouch cyst is most likely a normal variant.    Many cases may resolve spontaneously by 24 to 26 weeks.    There were no signs of fetal ventriculomegaly noted today and the cerebellar vermis appeared within normal limits.    We will continue to follow her to assess this finding.  Circumvallate placenta  The patient was advised that a circumvallate placenta is most likely a normal variant.    The edges of the placental margin is raised with a bowl or curved shape.  The small risk of fetal growth restriction associated with a circumvallate placenta was discussed.    Due to this finding, we will continue to follow her with growth ultrasounds  throughout her pregnancy.    A follow-up exam was scheduled in 5 weeks to complete the views of the fetal anatomy and to assess the fetal growth.  The patient stated that all of her questions were answered.   A total of 30 minutes was spent counseling and coordinating the care for this patient.  Greater than 50% of the time was spent in direct face-to-face contact.

## 2024-11-24 ENCOUNTER — Encounter: Admitting: Obstetrics and Gynecology

## 2024-12-15 ENCOUNTER — Ambulatory Visit (HOSPITAL_BASED_OUTPATIENT_CLINIC_OR_DEPARTMENT_OTHER)

## 2024-12-15 ENCOUNTER — Other Ambulatory Visit: Payer: Self-pay | Admitting: *Deleted

## 2024-12-15 ENCOUNTER — Ambulatory Visit: Attending: Obstetrics and Gynecology | Admitting: Obstetrics and Gynecology

## 2024-12-15 VITALS — BP 113/63 | HR 84

## 2024-12-15 DIAGNOSIS — O43112 Circumvallate placenta, second trimester: Secondary | ICD-10-CM | POA: Diagnosis present

## 2024-12-15 DIAGNOSIS — F1290 Cannabis use, unspecified, uncomplicated: Secondary | ICD-10-CM

## 2024-12-15 DIAGNOSIS — O99322 Drug use complicating pregnancy, second trimester: Secondary | ICD-10-CM

## 2024-12-15 DIAGNOSIS — O99320 Drug use complicating pregnancy, unspecified trimester: Secondary | ICD-10-CM | POA: Diagnosis not present

## 2024-12-15 DIAGNOSIS — O2612 Low weight gain in pregnancy, second trimester: Secondary | ICD-10-CM | POA: Insufficient documentation

## 2024-12-15 DIAGNOSIS — O210 Mild hyperemesis gravidarum: Secondary | ICD-10-CM | POA: Diagnosis not present

## 2024-12-15 DIAGNOSIS — E669 Obesity, unspecified: Secondary | ICD-10-CM

## 2024-12-15 DIAGNOSIS — O99210 Obesity complicating pregnancy, unspecified trimester: Secondary | ICD-10-CM

## 2024-12-15 DIAGNOSIS — O99212 Obesity complicating pregnancy, second trimester: Secondary | ICD-10-CM | POA: Insufficient documentation

## 2024-12-15 DIAGNOSIS — Z3A25 25 weeks gestation of pregnancy: Secondary | ICD-10-CM | POA: Insufficient documentation

## 2024-12-15 DIAGNOSIS — Z362 Encounter for other antenatal screening follow-up: Secondary | ICD-10-CM | POA: Diagnosis not present

## 2024-12-15 DIAGNOSIS — O0990 Supervision of high risk pregnancy, unspecified, unspecified trimester: Secondary | ICD-10-CM | POA: Diagnosis not present

## 2024-12-15 NOTE — Progress Notes (Signed)
 After review, MFM consult with provider is not indicated for today  Arna Ranks, MD 12/15/2024 9:48 AM  [facility]

## 2025-01-06 ENCOUNTER — Encounter: Admitting: Obstetrics & Gynecology

## 2025-02-09 ENCOUNTER — Ambulatory Visit

## 2025-02-09 ENCOUNTER — Other Ambulatory Visit
